# Patient Record
Sex: Female | Born: 1955
Health system: Southern US, Community
[De-identification: ages and names within clinical notes are randomized; demographics above are authoritative.]

## PROBLEM LIST (undated history)

## (undated) DIAGNOSIS — E7800 Pure hypercholesterolemia, unspecified: Secondary | ICD-10-CM

## (undated) DIAGNOSIS — I10 Essential (primary) hypertension: Secondary | ICD-10-CM

## (undated) DIAGNOSIS — K219 Gastro-esophageal reflux disease without esophagitis: Secondary | ICD-10-CM

## (undated) HISTORY — PX: TUBAL LIGATION: SHX77

## (undated) HISTORY — PX: CATARACT EXTRACTION: SUR2

---

## 1998-02-16 ENCOUNTER — Encounter: Payer: Self-pay | Admitting: Internal Medicine

## 1998-02-16 ENCOUNTER — Ambulatory Visit (HOSPITAL_COMMUNITY): Admission: RE | Admit: 1998-02-16 | Discharge: 1998-02-16 | Payer: Self-pay | Admitting: Internal Medicine

## 2000-02-15 ENCOUNTER — Other Ambulatory Visit: Admission: RE | Admit: 2000-02-15 | Discharge: 2000-02-15 | Payer: Self-pay | Admitting: Geriatric Medicine

## 2001-04-03 ENCOUNTER — Other Ambulatory Visit: Admission: RE | Admit: 2001-04-03 | Discharge: 2001-04-03 | Payer: Self-pay | Admitting: Internal Medicine

## 2003-04-15 ENCOUNTER — Other Ambulatory Visit: Admission: RE | Admit: 2003-04-15 | Discharge: 2003-04-15 | Payer: Self-pay | Admitting: Internal Medicine

## 2005-07-15 ENCOUNTER — Other Ambulatory Visit: Admission: RE | Admit: 2005-07-15 | Discharge: 2005-07-15 | Payer: Self-pay | Admitting: Internal Medicine

## 2007-08-20 ENCOUNTER — Other Ambulatory Visit: Admission: RE | Admit: 2007-08-20 | Discharge: 2007-08-20 | Payer: Self-pay | Admitting: Internal Medicine

## 2009-09-08 ENCOUNTER — Other Ambulatory Visit
Admission: RE | Admit: 2009-09-08 | Discharge: 2009-09-08 | Payer: Self-pay | Source: Home / Self Care | Admitting: Internal Medicine

## 2009-09-08 ENCOUNTER — Other Ambulatory Visit: Admission: RE | Admit: 2009-09-08 | Discharge: 2009-09-08 | Payer: Self-pay | Admitting: Internal Medicine

## 2011-09-22 ENCOUNTER — Other Ambulatory Visit: Payer: Self-pay | Admitting: Internal Medicine

## 2011-09-22 ENCOUNTER — Other Ambulatory Visit (HOSPITAL_COMMUNITY)
Admission: RE | Admit: 2011-09-22 | Discharge: 2011-09-22 | Disposition: A | Discharge status: Home / Self Care | Payer: BC Managed Care – PPO | Source: Ambulatory Visit | Attending: Internal Medicine | Admitting: Internal Medicine

## 2011-09-22 DIAGNOSIS — Z01419 Encounter for gynecological examination (general) (routine) without abnormal findings: Secondary | ICD-10-CM | POA: Insufficient documentation

## 2014-11-10 ENCOUNTER — Other Ambulatory Visit: Payer: Self-pay | Admitting: Gastroenterology

## 2015-09-02 DIAGNOSIS — H2513 Age-related nuclear cataract, bilateral: Secondary | ICD-10-CM | POA: Diagnosis not present

## 2015-10-15 DIAGNOSIS — R05 Cough: Secondary | ICD-10-CM | POA: Diagnosis not present

## 2015-10-20 DIAGNOSIS — Z803 Family history of malignant neoplasm of breast: Secondary | ICD-10-CM | POA: Diagnosis not present

## 2015-10-20 DIAGNOSIS — Z1231 Encounter for screening mammogram for malignant neoplasm of breast: Secondary | ICD-10-CM | POA: Diagnosis not present

## 2015-10-21 DIAGNOSIS — D225 Melanocytic nevi of trunk: Secondary | ICD-10-CM | POA: Diagnosis not present

## 2015-10-21 DIAGNOSIS — L821 Other seborrheic keratosis: Secondary | ICD-10-CM | POA: Diagnosis not present

## 2015-10-21 DIAGNOSIS — D2261 Melanocytic nevi of right upper limb, including shoulder: Secondary | ICD-10-CM | POA: Diagnosis not present

## 2015-10-21 DIAGNOSIS — L918 Other hypertrophic disorders of the skin: Secondary | ICD-10-CM | POA: Diagnosis not present

## 2015-10-21 DIAGNOSIS — D2262 Melanocytic nevi of left upper limb, including shoulder: Secondary | ICD-10-CM | POA: Diagnosis not present

## 2016-03-21 DIAGNOSIS — K219 Gastro-esophageal reflux disease without esophagitis: Secondary | ICD-10-CM | POA: Diagnosis not present

## 2016-03-21 DIAGNOSIS — Z79899 Other long term (current) drug therapy: Secondary | ICD-10-CM | POA: Diagnosis not present

## 2016-03-21 DIAGNOSIS — I10 Essential (primary) hypertension: Secondary | ICD-10-CM | POA: Diagnosis not present

## 2016-03-21 DIAGNOSIS — Z0001 Encounter for general adult medical examination with abnormal findings: Secondary | ICD-10-CM | POA: Diagnosis not present

## 2016-03-21 DIAGNOSIS — Z23 Encounter for immunization: Secondary | ICD-10-CM | POA: Diagnosis not present

## 2016-03-21 DIAGNOSIS — E559 Vitamin D deficiency, unspecified: Secondary | ICD-10-CM | POA: Diagnosis not present

## 2016-03-21 DIAGNOSIS — E782 Mixed hyperlipidemia: Secondary | ICD-10-CM | POA: Diagnosis not present

## 2016-06-21 DIAGNOSIS — G8929 Other chronic pain: Secondary | ICD-10-CM | POA: Diagnosis not present

## 2016-06-21 DIAGNOSIS — M25562 Pain in left knee: Secondary | ICD-10-CM | POA: Diagnosis not present

## 2016-11-07 DIAGNOSIS — H524 Presbyopia: Secondary | ICD-10-CM | POA: Diagnosis not present

## 2016-11-07 DIAGNOSIS — H5203 Hypermetropia, bilateral: Secondary | ICD-10-CM | POA: Diagnosis not present

## 2016-11-07 DIAGNOSIS — H52223 Regular astigmatism, bilateral: Secondary | ICD-10-CM | POA: Diagnosis not present

## 2016-11-07 DIAGNOSIS — H2513 Age-related nuclear cataract, bilateral: Secondary | ICD-10-CM | POA: Diagnosis not present

## 2016-11-08 DIAGNOSIS — M8588 Other specified disorders of bone density and structure, other site: Secondary | ICD-10-CM | POA: Diagnosis not present

## 2016-11-08 DIAGNOSIS — Z1231 Encounter for screening mammogram for malignant neoplasm of breast: Secondary | ICD-10-CM | POA: Diagnosis not present

## 2016-12-17 DIAGNOSIS — Z23 Encounter for immunization: Secondary | ICD-10-CM | POA: Diagnosis not present

## 2017-06-21 DIAGNOSIS — K219 Gastro-esophageal reflux disease without esophagitis: Secondary | ICD-10-CM | POA: Diagnosis not present

## 2017-06-21 DIAGNOSIS — Z0001 Encounter for general adult medical examination with abnormal findings: Secondary | ICD-10-CM | POA: Diagnosis not present

## 2017-06-21 DIAGNOSIS — E559 Vitamin D deficiency, unspecified: Secondary | ICD-10-CM | POA: Diagnosis not present

## 2017-06-21 DIAGNOSIS — M859 Disorder of bone density and structure, unspecified: Secondary | ICD-10-CM | POA: Diagnosis not present

## 2017-06-21 DIAGNOSIS — I10 Essential (primary) hypertension: Secondary | ICD-10-CM | POA: Diagnosis not present

## 2017-06-21 DIAGNOSIS — E782 Mixed hyperlipidemia: Secondary | ICD-10-CM | POA: Diagnosis not present

## 2017-11-17 DIAGNOSIS — Z1231 Encounter for screening mammogram for malignant neoplasm of breast: Secondary | ICD-10-CM | POA: Diagnosis not present

## 2017-12-30 DIAGNOSIS — Z23 Encounter for immunization: Secondary | ICD-10-CM | POA: Diagnosis not present

## 2018-01-01 DIAGNOSIS — H9192 Unspecified hearing loss, left ear: Secondary | ICD-10-CM | POA: Diagnosis not present

## 2018-01-01 DIAGNOSIS — H6122 Impacted cerumen, left ear: Secondary | ICD-10-CM | POA: Diagnosis not present

## 2018-04-10 DIAGNOSIS — L600 Ingrowing nail: Secondary | ICD-10-CM | POA: Diagnosis not present

## 2018-05-18 ENCOUNTER — Ambulatory Visit (INDEPENDENT_AMBULATORY_CARE_PROVIDER_SITE_OTHER): Payer: BLUE CROSS/BLUE SHIELD | Admitting: Podiatry

## 2018-05-18 ENCOUNTER — Encounter: Payer: Self-pay | Admitting: Podiatry

## 2018-05-18 VITALS — BP 157/83

## 2018-05-18 DIAGNOSIS — L600 Ingrowing nail: Secondary | ICD-10-CM

## 2018-05-18 MED ORDER — NEOMYCIN-POLYMYXIN-HC 3.5-10000-1 OT SOLN: OTIC | 0 refills | Status: DC

## 2018-05-18 NOTE — Patient Instructions (Signed)

## 2018-05-20 NOTE — Progress Notes (Signed)
Subjective:   Patient ID: Miranda Wells, female   DOB: 63 y.o.   MRN: [id number]   HPI Patient states she is had a chronic ingrown toenail deformity of her right big toe and it makes it hard for her to wear shoe gear comfortably or be comfortable and she is tried soaks and trimming.  Patient does not smoke and likes to be active   Review of Systems  All other systems reviewed and are negative.       Objective:  Physical Exam Vitals signs and nursing note reviewed.  Constitutional:      Appearance: She is well-developed.  Pulmonary:     Effort: Pulmonary effort is normal.  Musculoskeletal: Normal range of motion.  Skin:    General: Skin is warm.  Neurological:     Mental Status: She is alert.     Neurovascular status intact muscle strength is adequate range of motion within normal limits with patient found to have an incurvated right hallux medial border that is painful when pressed and is making shoe gear difficult.  Patient has good digital perfusion is well oriented x3 with no redness or drainage associated with the nail deformity.  Patient states it sore when palpated     Assessment:  Ingrown toenail deformity right hallux medial border with pain     Plan:  H&P condition reviewed and recommended correction.  I explained the procedure and risk and patient wants surgery and signed consent form today I infiltrated the right hallux 60 mg like Marcaine mixture removed the medial border after sterile prep with sterile instrumentation exposed matrix and applied phenol 3 applications 30 seconds followed by alcohol lavage and sterile dressing.  Gave instructions on soaks and to leave dressing on 24 hours but take off earlier if it should start to throb and also went ahead today and wrote prescription for drops to use after soaking and encouraged to call with questions

## 2018-11-21 DIAGNOSIS — Z1231 Encounter for screening mammogram for malignant neoplasm of breast: Secondary | ICD-10-CM | POA: Diagnosis not present

## 2018-11-21 DIAGNOSIS — E559 Vitamin D deficiency, unspecified: Secondary | ICD-10-CM | POA: Diagnosis not present

## 2018-11-21 DIAGNOSIS — I10 Essential (primary) hypertension: Secondary | ICD-10-CM | POA: Diagnosis not present

## 2018-12-10 DIAGNOSIS — Z23 Encounter for immunization: Secondary | ICD-10-CM | POA: Diagnosis not present

## 2018-12-10 DIAGNOSIS — I10 Essential (primary) hypertension: Secondary | ICD-10-CM | POA: Diagnosis not present

## 2018-12-10 DIAGNOSIS — Z0000 Encounter for general adult medical examination without abnormal findings: Secondary | ICD-10-CM | POA: Diagnosis not present

## 2018-12-10 DIAGNOSIS — M8580 Other specified disorders of bone density and structure, unspecified site: Secondary | ICD-10-CM | POA: Diagnosis not present

## 2018-12-10 DIAGNOSIS — E782 Mixed hyperlipidemia: Secondary | ICD-10-CM | POA: Diagnosis not present

## 2018-12-10 DIAGNOSIS — E876 Hypokalemia: Secondary | ICD-10-CM | POA: Diagnosis not present

## 2019-01-14 DIAGNOSIS — I10 Essential (primary) hypertension: Secondary | ICD-10-CM | POA: Diagnosis not present

## 2019-04-03 ENCOUNTER — Other Ambulatory Visit (HOSPITAL_COMMUNITY): Payer: Self-pay | Admitting: Internal Medicine

## 2019-04-03 DIAGNOSIS — R1011 Right upper quadrant pain: Secondary | ICD-10-CM | POA: Diagnosis not present

## 2019-04-03 DIAGNOSIS — K824 Cholesterolosis of gallbladder: Secondary | ICD-10-CM | POA: Diagnosis not present

## 2019-04-05 ENCOUNTER — Emergency Department (HOSPITAL_COMMUNITY): Payer: BC Managed Care – PPO

## 2019-04-05 ENCOUNTER — Encounter (HOSPITAL_COMMUNITY): Payer: Self-pay | Admitting: Emergency Medicine

## 2019-04-05 ENCOUNTER — Other Ambulatory Visit: Payer: Self-pay

## 2019-04-05 ENCOUNTER — Emergency Department (HOSPITAL_COMMUNITY)
Admission: EM | Admit: 2019-04-05 | Discharge: 2019-04-05 | Disposition: A | Discharge status: Home / Self Care | Payer: BC Managed Care – PPO | Attending: Emergency Medicine | Admitting: Emergency Medicine

## 2019-04-05 DIAGNOSIS — Z20822 Contact with and (suspected) exposure to covid-19: Secondary | ICD-10-CM | POA: Insufficient documentation

## 2019-04-05 DIAGNOSIS — R1013 Epigastric pain: Secondary | ICD-10-CM | POA: Diagnosis not present

## 2019-04-05 DIAGNOSIS — K828 Other specified diseases of gallbladder: Secondary | ICD-10-CM | POA: Diagnosis not present

## 2019-04-05 DIAGNOSIS — K5730 Diverticulosis of large intestine without perforation or abscess without bleeding: Secondary | ICD-10-CM | POA: Diagnosis not present

## 2019-04-05 DIAGNOSIS — Z79899 Other long term (current) drug therapy: Secondary | ICD-10-CM | POA: Diagnosis not present

## 2019-04-05 DIAGNOSIS — Z87891 Personal history of nicotine dependence: Secondary | ICD-10-CM | POA: Diagnosis not present

## 2019-04-05 DIAGNOSIS — I10 Essential (primary) hypertension: Secondary | ICD-10-CM | POA: Diagnosis not present

## 2019-04-05 DIAGNOSIS — Z03818 Encounter for observation for suspected exposure to other biological agents ruled out: Secondary | ICD-10-CM | POA: Diagnosis not present

## 2019-04-05 DIAGNOSIS — R1084 Generalized abdominal pain: Secondary | ICD-10-CM | POA: Diagnosis not present

## 2019-04-05 LAB — URINALYSIS, ROUTINE W REFLEX MICROSCOPIC
Bacteria, UA: NONE SEEN
Bilirubin Urine: NEGATIVE
Glucose, UA: 50 mg/dL — AB
Hgb urine dipstick: NEGATIVE
Ketones, ur: 5 mg/dL — AB
Nitrite: NEGATIVE
Protein, ur: 30 mg/dL — AB
Specific Gravity, Urine: 1.019 (ref 1.005–1.030)
pH: 6 (ref 5.0–8.0)

## 2019-04-05 LAB — COMPREHENSIVE METABOLIC PANEL
ALT: 14 U/L (ref 0–44)
AST: 18 U/L (ref 15–41)
Albumin: 4.1 g/dL (ref 3.5–5.0)
Alkaline Phosphatase: 68 U/L (ref 38–126)
Anion gap: 14 (ref 5–15)
BUN: 8 mg/dL (ref 8–23)
CO2: 24 mmol/L (ref 22–32)
Calcium: 9.4 mg/dL (ref 8.9–10.3)
Chloride: 88 mmol/L — ABNORMAL LOW (ref 98–111)
Creatinine, Ser: 0.67 mg/dL (ref 0.44–1.00)
GFR calc Af Amer: >60 mL/min (ref >60)
GFR calc non Af Amer: >60 mL/min (ref >60)
Glucose, Bld: 128 mg/dL — ABNORMAL HIGH (ref 70–99)
Potassium: 2.8 mmol/L — ABNORMAL LOW (ref 3.5–5.1)
Sodium: 126 mmol/L — ABNORMAL LOW (ref 135–145)
Total Bilirubin: 0.4 mg/dL (ref 0.3–1.2)
Total Protein: 7.1 g/dL (ref 6.5–8.1)

## 2019-04-05 LAB — RESPIRATORY PANEL BY RT PCR (FLU A&B, COVID)
Influenza A by PCR: NEGATIVE
Influenza B by PCR: NEGATIVE
SARS Coronavirus 2 by RT PCR: NEGATIVE

## 2019-04-05 LAB — CBC
HCT: 36.7 % (ref 36.0–46.0)
Hemoglobin: 13 g/dL (ref 12.0–15.0)
MCH: 29 pg (ref 26.0–34.0)
MCHC: 35.4 g/dL (ref 30.0–36.0)
MCV: 81.7 fL (ref 80.0–100.0)
Platelets: 315 10*3/uL (ref 150–400)
RBC: 4.49 MIL/uL (ref 3.87–5.11)
RDW: 12.7 % (ref 11.5–15.5)
WBC: 11.6 10*3/uL — ABNORMAL HIGH (ref 4.0–10.5)
nRBC: 0 % (ref 0.0–0.2)

## 2019-04-05 LAB — LIPASE, BLOOD: Lipase: 20 U/L (ref 11–51)

## 2019-04-05 MED ORDER — OMEPRAZOLE 40 MG PO CPDR: 40.0000 mg | DELAYED_RELEASE_CAPSULE | Freq: Every day | ORAL | 0 refills | Status: AC

## 2019-04-05 MED ORDER — POTASSIUM CHLORIDE 10 MEQ/100ML IV SOLN
10.0000 meq | Freq: Once | INTRAVENOUS | Status: AC
  Administered 2019-04-05: 10 meq via INTRAVENOUS
  Filled 2019-04-05: qty 100

## 2019-04-05 MED ORDER — TECHNETIUM TC 99M MEBROFENIN IV KIT
5.0000 | PACK | Freq: Once | INTRAVENOUS | Status: AC | PRN
  Administered 2019-04-05: 5 via INTRAVENOUS

## 2019-04-05 MED ORDER — KETOROLAC TROMETHAMINE 30 MG/ML IJ SOLN
30.0000 mg | Freq: Once | INTRAMUSCULAR | Status: AC
  Administered 2019-04-05: 30 mg via INTRAVENOUS
  Filled 2019-04-05: qty 1

## 2019-04-05 MED ORDER — ONDANSETRON HCL 4 MG PO TABS: 4.0000 mg | ORAL_TABLET | Freq: Four times a day (QID) | ORAL | 0 refills | Status: DC | PRN

## 2019-04-05 MED ORDER — ONDANSETRON HCL 4 MG/2ML IJ SOLN
4.0000 mg | Freq: Once | INTRAMUSCULAR | Status: AC
  Administered 2019-04-05: 4 mg via INTRAVENOUS
  Filled 2019-04-05: qty 2

## 2019-04-05 MED ORDER — OXYCODONE HCL 5 MG PO TABS: 5.0000 mg | ORAL_TABLET | Freq: Four times a day (QID) | ORAL | 0 refills | Status: DC | PRN

## 2019-04-05 MED ORDER — SODIUM CHLORIDE 0.9% FLUSH
3.0000 mL | Freq: Once | INTRAVENOUS | Status: AC
  Administered 2019-04-05: 3 mL via INTRAVENOUS

## 2019-04-05 MED ORDER — IOHEXOL 300 MG/ML  SOLN
100.0000 mL | Freq: Once | INTRAMUSCULAR | Status: AC | PRN
  Administered 2019-04-05: 100 mL via INTRAVENOUS

## 2019-04-05 MED ORDER — SODIUM CHLORIDE 0.9 % IV BOLUS
1000.0000 mL | Freq: Once | INTRAVENOUS | Status: AC
  Administered 2019-04-05: 1000 mL via INTRAVENOUS

## 2019-04-05 NOTE — ED Notes (Signed)
Family at bedside. 

## 2019-04-05 NOTE — Consult Note (Signed)
Miranda Wells January 10, 1956  [id number].    Requesting MD: Dr. Lynelle Doctor Chief Complaint: Epigastric Abdominal Pain Reason for Consult: Biliary dyskinesia  HPI: Miranda Wells is a 64 y.o. female with a history of hypertension, hyperlipidemia, and reported GERD who presented to [facility] ED for epigastric abdominal pain.  Patient reports that Monday evening after eating a banana she began having nonradiating epigastric abdominal pain that she describes as cramping with associated chills and nausea that lasted for ~1 hour.  She went to her PCP the next day who ordered a right upper quadrant ultrasound that showed sludge in the gallbladder.  The patient continued to have daily episodes of pain after eating despite several different food choices including trying to avoid greasy foods.  She tried Tylenol with some relief.  Her symptoms typically lasted for less than 1 hour before resolving however yesterday her symptoms lasted for >5 hours which prompted her to come to the emergency department. At that time her pain was a 8/10.  In the ED she received Toradol and Zofran which fully relieved her symptoms (@ 0700). No further pain medication was required.   In the ED she was afebrile.  WBC mildly elevated at 11.6.  LFTs within normal limits.  Lipase within normal limits.  CT of the abdomen and pelvis was without any acute abnormalities.  There was a small hiatal hernia.  Gallbladder was distended with no calcified stone.  HIDA scan was ordered that showed low EF of 26% consistent with biliary dyskinesia.  Patient did experience clinical symptoms of pain after oral Ensure.  She reports this lasted for several minutes but has now returned to being pain-free again.  Cystic and common bile ducts were patent on HIDA scan.  EDP asked general surgery to see for further recommendations.  Patient denies history of similar pain prior to Monday.  She does report of the last 6 months she has been suffering from heartburn,  burping and belching that occurs after eating.  She believes she is on an acid reflux medication but is unsure what this is.  She has never seen a GI doctor before. She is not on any blood thinners. She reports hx of tubal ligation. No other abdominal surgeries.   ROS: Review of Systems  Constitutional: Positive for chills. Negative for fever.  HENT: Negative for sinus pain.   Respiratory: Negative for cough and shortness of breath.   Cardiovascular: Negative for chest pain.  Gastrointestinal: Positive for abdominal pain and nausea. Negative for constipation, diarrhea and vomiting.  Genitourinary: Negative for dysuria and frequency.  Musculoskeletal: Positive for back pain.  Skin: Negative for rash.  All other systems reviewed and are negative.   No family history on file.  History reviewed. No pertinent past medical history.  History reviewed. No pertinent surgical history.  Social History:  reports that she has quit smoking. She has quit using smokeless tobacco. She reports that she does not drink alcohol or use drugs.  Allergies:  Allergies  Allergen Reactions  . Ace Inhibitors Cough    (Not in a hospital admission)    Physical Exam: Blood pressure (!) 148/70, pulse 73, temperature 97.7 F (36.5 C), temperature source Oral, resp. rate 16, SpO2 97 %. General: pleasant, WD, WN white female who is laying in bed in NAD HEENT: head is normocephalic, atraumatic.  Sclera are noninjected.  PERRL.  Ears and nose without any masses or lesions.  Mouth with mask over  Heart: regular, rate, and  rhythm.  Normal s1,s2. No obvious murmurs, gallops, or rubs noted.  Palpable radial and pedal pulses bilaterally Lungs: CTAB, no wheezes, rhonchi, or rales noted.  Respiratory effort nonlabored Abd: soft, ND, very mild tenderness of the epigastrium and RUQ. No r/r/g. No peritonitis. Neg Murphy's sign.+BS, no masses, hernias, or organomegaly. No abdominal scars.  MS: all 4 extremities are  symmetrical with no cyanosis, clubbing, or edema. Skin: warm and dry with no masses, lesions, or rashes Psych: A&Ox3 with an appropriate affect.  Results for orders placed or performed during the hospital encounter of 04/05/19 (from the past 48 hour(s))  Urinalysis, Routine w reflex microscopic     Status: Abnormal   Collection Time: 04/05/19  2:41 AM  Result Value Ref Range   Color, Urine YELLOW YELLOW   APPearance HAZY (A) CLEAR   Specific Gravity, Urine 1.019 1.005 - 1.030   pH 6.0 5.0 - 8.0   Glucose, UA 50 (A) NEGATIVE mg/dL   Hgb urine dipstick NEGATIVE NEGATIVE   Bilirubin Urine NEGATIVE NEGATIVE   Ketones, ur 5 (A) NEGATIVE mg/dL   Protein, ur 30 (A) NEGATIVE mg/dL   Nitrite NEGATIVE NEGATIVE   Leukocytes,Ua TRACE (A) NEGATIVE   RBC / HPF 0-5 0 - 5 RBC/hpf   WBC, UA 0-5 0 - 5 WBC/hpf   Bacteria, UA NONE SEEN NONE SEEN   Squamous Epithelial / LPF 0-5 0 - 5   Mucus PRESENT    Non Squamous Epithelial 0-5 (A) NONE SEEN    Comment: Performed at [facility], [address], [place], [place] [address]  Lipase, blood     Status: None   Collection Time: 04/05/19  2:44 AM  Result Value Ref Range   Lipase 20 11 - 51 U/L    Comment: Performed at [facility], [address] [place], [place] [address]  Comprehensive metabolic panel     Status: Abnormal   Collection Time: 04/05/19  2:44 AM  Result Value Ref Range   Sodium 126 (L) 135 - 145 mmol/L   Potassium 2.8 (L) 3.5 - 5.1 mmol/L   Chloride 88 (L) 98 - 111 mmol/L   CO2 24 22 - 32 mmol/L   Glucose, Bld 128 (H) 70 - 99 mg/dL   BUN 8 8 - 23 mg/dL   Creatinine, Ser 2.54 0.44 - 1.00 mg/dL   Calcium 9.4 8.9 - 27.0 mg/dL   Total Protein 7.1 6.5 - 8.1 g/dL   Albumin 4.1 3.5 - 5.0 g/dL   AST 18 15 - 41 U/L   ALT 14 0 - 44 U/L   Alkaline Phosphatase 68 38 - 126 U/L   Total Bilirubin 0.4 0.3 - 1.2 mg/dL   GFR calc non Af Amer >60 >60 mL/min   GFR calc Af Amer >60 >60 mL/min   Anion gap 14 5 - 15    Comment:  Performed at [facility], [address] [place], [place] [address]  CBC     Status: Abnormal   Collection Time: 04/05/19  2:44 AM  Result Value Ref Range   WBC 11.6 (H) 4.0 - 10.5 K/uL   RBC 4.49 3.87 - 5.11 MIL/uL   Hemoglobin 13.0 12.0 - 15.0 g/dL   HCT 28.3 15.1 - 76.1 %   MCV 81.7 80.0 - 100.0 fL   MCH 29.0 26.0 - 34.0 pg   MCHC 35.4 30.0 - 36.0 g/dL   RDW 60.7 37.1 - 06.2 %   Platelets 315 150 -  400 K/uL   nRBC 0.0 0.0 - 0.2 %    Comment: Performed at [facility], [place] [address] [place], [place] [address]   CT ABDOMEN PELVIS W CONTRAST  Result Date: 04/05/2019 CLINICAL DATA:  Chest and abdominal cramping. EXAM: CT ABDOMEN AND PELVIS WITH CONTRAST TECHNIQUE: Multidetector CT imaging of the abdomen and pelvis was performed using the standard protocol following bolus administration of intravenous contrast. CONTRAST:  173mL OMNIPAQUE IOHEXOL 300 MG/ML  SOLN COMPARISON:  Right upper quadrant ultrasound 04/03/2019 FINDINGS: Lower chest: The lung bases are clear. There are coronary artery calcifications. Heart is normal in size. Hepatobiliary: Tiny subcapsular hypodensity in the right hepatic dome, too small to characterize but likely small cyst. Gallbladder physiologically distended, no calcified stone. No biliary dilatation. Pancreas: No ductal dilatation or inflammation. Spleen: Normal in size without focal abnormality. Adrenals/Urinary Tract: Normal adrenal glands. No hydronephrosis or perinephric edema. Homogeneous renal enhancement with symmetric excretion on delayed phase imaging. Tiny subcentimeter hypodensities in both kidneys are too small to characterize but likely small cysts. No visualized renal calculi. Urinary bladder is partially distended without wall thickening. Stomach/Bowel: Tiny hiatal hernia. Stomach is otherwise unremarkable. No bowel obstruction, inflammation, or wall thickening. Normal appendix. Moderate stool burden in the colon. No colonic wall thickening  or inflammation. Mild distal diverticulosis without diverticulitis. Vascular/Lymphatic: Advanced aortic atherosclerosis. No aortic aneurysm. The portal vein is patent. No enlarged lymph nodes in the abdomen or pelvis. Reproductive: Uterus and bilateral adnexa are unremarkable. Other: No free air, free fluid, or intra-abdominal fluid collection. Tiny fat containing umbilical hernia. Musculoskeletal: Endplate sclerosis inferior L4 vertebral body likely Modic endplate changes. Associated L4-L5 disc space narrowing. Small bone island in the right proximal femur. IMPRESSION: 1. No acute abnormality in the abdomen/pelvis. 2. Mild distal colonic diverticulosis without diverticulitis. Small hiatal hernia. 3. Moderately advanced aortic atherosclerosis. Aortic Atherosclerosis (ICD10-I70.0). Electronically Signed   By: Keith Rake M.D.   On: 04/05/2019 06:33   NM Hepato W/EF  Result Date: 04/05/2019 CLINICAL DATA:  Epigastric region pain EXAM: NUCLEAR MEDICINE HEPATOBILIARY IMAGING WITH GALLBLADDER EF VIEWS: Anterior right upper quadrant RADIOPHARMACEUTICALS:  5.2 mCi Tc-34m  Choletec IV COMPARISON:  None. FINDINGS: Liver uptake of radiotracer is unremarkable. There is prompt visualization of gallbladder and small bowel, indicating patency of the cystic and common bile ducts. The patient consumed 8 ounces of Ensure orally with calculation of the computer generated ejection fraction of radiotracer from the gallbladder. The patient experienced abdominal pain and spasm with the oral Ensure consumption. The computer generated ejection fraction of radiotracer from the gallbladder is abnormally low at 26%, normal greater than 33% using the oral agent. IMPRESSION: Abnormally low ejection fraction of radiotracer gallbladder, a finding indicative of biliary dyskinesia. Patient experience clinical symptoms with the oral Ensure consumption. Cystic and common bile ducts are patent as is evidenced by visualization of gallbladder  and small bowel. Electronically Signed   By: Lowella Grip III M.D.   On: 04/05/2019 10:16      Assessment/Plan HTN HLD Burping/Belching/hx of GERD - start on omeprazole   Epigastric abdominal pain Biliary Dyskinesia - HIDA scan showed no evidence of cystic duct obstruction to suggest acute cholecystitis.  LFTs within normal limits.  She reports that her pain has resolved since Toradol injection at 7 AM this morning. Abd exam is reassuring.  -I had a long discussion with the patient about her symptoms, lab work and imaging.  We discussed the possibility of admission for laparoscopic cholecystectomy tomorrow if  schedule allows.  We discussed the success of symptom relief after cholecystectomy for biliary dyskinesia is roughly 50%.  We also discussed possibility of treating her as an outpatient with pain/nausea medication and diet modification with close follow-up in our clinic to discuss possible arrangement of surgery as an outpatient.  -Given the patient is currently symptom-free, she felt it was reasonable to try treatment as an outpatient with close follow up.  I discussed eating low-fat and smaller meals.  I will send her home with a prescription of oxycodone for pain.  We discussed Tylenol as first-line for pain.  I am also starting her on omeprazole given that she reports 6 months of burping/belching that typically occurs after eating.  -We discussed reasons to call our office sooner or return to the emergency department -I discussed this with my attending as well as the ED attending who felt this was appropriate  Jacinto Halim, [facility] Surgery 04/05/2019, 1:26 PM Please see Amion for pager number during day hours 7:00am-4:30pm

## 2019-04-05 NOTE — Discharge Instructions (Signed)
Biliary Dyskinesia  Biliary dyskinesia is a condition in which the gallbladder or bile ducts cannot release or move bile normally. Bile is a fluid that is made in the liver. It helps the body to digest food. Bile flows to the gallbladder to be stored. When bile is needed for digestion, it leaves the gallbladder and flows through the bile ducts into the digestive tract. Biliary dyskinesia causes bile to build up, and that can cause abdomen (abdominal)pain. This condition may also be called:  Acalculous cholecystopathy.  Functional gallbladder disorder.  Sphincter of Oddi dysfunction. All these names describe gallbladder disorders that are not caused by gallbladder stones. Sphincter of Oddi dysfunction happens in the area where the duct for digestive juices from the pancreas (pancreatic duct) joins the bile duct before emptying into the digestive tract. If the opening into the digestive tract is too small, bile and pancreatic juices may back up and cause abdominal pain. What are the causes? The cause of biliary dyskinesia is poor function of the gallbladder. The exact reason this happens is often unknown. One reason may be changes in the gallbladder that are caused by obesity. What increases the risk? You are more likely to develop this condition if your mother or father had it, or if you are:  Overweight.  Female.  57-68 years old. What are the signs or symptoms? The main symptom of this condition is pain in the upper right side of the abdomen. Typically, the pain:  Starts about 30 minutes after a meal, especially a meal that is spicy or greasy.  Lasts for 30 minutes or longer.  Builds up gradually until it is a steady pain that is severe enough to interrupt daily activities. Other symptoms may include:  Nausea.  Vomiting.  Cramping.  Bloating.  Heartburn.  Diarrhea. How is this diagnosed? This condition may be diagnosed based on your symptoms, your medical history, and a  physical exam. You may have tests to rule out other conditions and to confirm the diagnosis. These may include:  Blood tests.  Ultrasound tests of the gallbladder.  HIDA scan. This is an X-ray test that can show if your gallbladder empties less than a normal amount of bile (gallbladder ejection fraction).  MRI or CT scan of the abdomen.  ERCP (endoscopic retrograde cholangiopancreatogram). During this procedure, a thin tube with a camera on the end is inserted into the throat and down into areas that need examination, such as the pancreas, bile ducts, liver, and gallbladder. Dye is injected into your blood, then X-rays are done. The dye helps your health care provider to see the areas that need examination. ERCP may be done to help diagnose sphincter of Oddi dysfunction. How is this treated? Treatment depends on the cause. The first step of treatment is usually to make lifestyle changes. Your health care provider may recommend:  Taking over-the-counter or prescription pain medicine.  Resting.  Losing weight.  Avoiding foods that are spicy or greasy. In some cases, the condition gets better with lifestyle changes only. However, in many cases, surgery to remove the gallbladder (cholecystectomy) is necessary. Follow these instructions at home:  Take over-the-counter and prescription medicines only as told by your health care provider.  Do not drive or use heavy machinery while taking prescription pain medicine.  If you are taking prescription pain medicine, take actions to prevent or treat constipation. Your health care provider may recommend that you: ? Drink enough fluid to keep your urine pale yellow. ? Eat foods that  are high in fiber, such as fresh fruits and vegetables, whole grains, and beans. ? Limit foods that are high in fat and processed sugars, such as fried or sweet foods. ? Take an over-the-counter or prescription medicine for constipation.  Rest and return to your normal  activities as told by your health care provider. Ask your health care provider what activities are safe for you.  Follow instructions from your health care provider about eating or drinking restrictions. You may need to limit fatty, greasy, and spicy foods if they cause symptoms.  Limit alcohol intake to no more than 1 drink a day for nonpregnant women and 2 drinks a day for men. One drink equals 12 oz of beer, 5 oz of wine, or 1 oz of hard liquor. Alcohol can irritate your stomach and your liver.  Do not use any products that contain nicotine or tobacco, such as cigarettes and e-cigarettes. If you need help quitting, ask your health care provider. Smoking can damage your digestive system.  Keep all follow-up visits as told by your health care provider. This is important. Contact a health care provider if:  Your abdominal pain comes back.  You have any of the following: ? Nausea. ? Vomiting. ? Diarrhea. ? Cramping. ? Bloating. ? Diarrhea. Summary  Biliary dyskinesia is a condition in which your gallbladder or bile ducts cannot release or move bile normally.  The main symptom of this condition is pain in the upper right side of the abdomen. The pain typically starts about 30 minutes after a meal, especially a meal that is spicy or greasy.  Treatment may include lifestyle changes, such as working to lose weight and avoiding certain foods. In many cases, surgery to remove the gallbladder (cholecystectomy) is necessary. This information is not intended to replace advice given to you by your health care provider. Make sure you discuss any questions you have with your health care provider. Document Revised: 02/10/2017 Document Reviewed: 12/01/2016 Elsevier Patient Education  2020 [employer]    Gallbladder Eating Plan If you have a gallbladder condition, you may have trouble digesting fats. Eating a low-fat diet can help reduce your symptoms, and may be helpful before and after having  surgery to remove your gallbladder (cholecystectomy). Your health care provider may recommend that you work with a diet and nutrition specialist (dietitian) to help you reduce the amount of fat in your diet. What are tips for following this plan? General guidelines  Limit your fat intake to less than 30% of your total daily calories. If you eat around 1,800 calories each day, this is less than 60 grams (g) of fat per day.  Fat is an important part of a healthy diet. Eating a low-fat diet can make it hard to maintain a healthy body weight. Ask your dietitian how much fat, calories, and other nutrients you need each day.  Eat small, frequent meals throughout the day instead of three large meals.  Drink at least 8-10 cups of fluid a day. Drink enough fluid to keep your urine clear or pale yellow.  Limit alcohol intake to no more than 1 drink a day for nonpregnant women and 2 drinks a day for men. One drink equals 12 oz of beer, 5 oz of wine, or 1 oz of hard liquor. Reading food labels  Check Nutrition Facts on food labels for the amount of fat per serving. Choose foods with less than 3 grams of fat per serving. Shopping  Choose nonfat and low-fat healthy  foods. Look for the words "nonfat," "low fat," or "fat free."  Avoid buying processed or prepackaged foods. Cooking  Cook using low-fat methods, such as baking, broiling, grilling, or boiling.  Cook with small amounts of healthy fats, such as olive oil, grapeseed oil, canola oil, or sunflower oil. What foods are recommended?   All fresh, frozen, or canned fruits and vegetables.  Whole grains.  Low-fat or non-fat (skim) milk and yogurt.  Lean meat, skinless poultry, fish, eggs, and beans.  Low-fat protein supplement powders or drinks.  Spices and herbs. What foods are not recommended?  High-fat foods. These include baked goods, fast food, fatty cuts of meat, ice cream, french toast, sweet rolls, pizza, cheese bread, foods  covered with butter, creamy sauces, or cheese.  Fried foods. These include french fries, tempura, battered fish, breaded chicken, fried breads, and sweets.  Foods with strong odors.  Foods that cause bloating and gas. Summary  A low-fat diet can be helpful if you have a gallbladder condition, or before and after gallbladder surgery.  Limit your fat intake to less than 30% of your total daily calories. This is about 60 g of fat if you eat 1,800 calories each day.  Eat small, frequent meals throughout the day instead of three large meals. This information is not intended to replace advice given to you by your health care provider. Make sure you discuss any questions you have with your health care provider. Document Revised: 06/21/2018 Document Reviewed: 04/07/2016 Elsevier Patient Education  [employer].

## 2019-04-05 NOTE — ED Provider Notes (Signed)
Patient was initially seen by Dr. Judd Lien.  Please see his note.  Patient presented with increasing abdominal discomfort associated with eating.  She had an outpatient work-up that showed biliary sludge but no cholecystitis.  Patient had a HIDA scan performed here in the ED.  Patient's HIDA scan demonstrates abnormally low ejection fraction suggestive of biliary dyskinesia.  Considering the patient's worsening symptoms I will consult with general surgery to get their recommendations.  1:33 PM PT seen by general surgery team.  Options discussed with pt.  Pt opted for outpatient management.   CT ABDOMEN PELVIS W CONTRAST  Result Date: 04/05/2019 CLINICAL DATA:  Chest and abdominal cramping. EXAM: CT ABDOMEN AND PELVIS WITH CONTRAST TECHNIQUE: Multidetector CT imaging of the abdomen and pelvis was performed using the standard protocol following bolus administration of intravenous contrast. CONTRAST:  OMNIPAQUE IOHEXOL 300 MG/ML  SOLN COMPARISON:  Right upper quadrant ultrasound 04/03/2019 FINDINGS: Lower chest: The lung bases are clear. There are coronary artery calcifications. Heart is normal in size. Hepatobiliary: Tiny subcapsular hypodensity in the right hepatic dome, too small to characterize but likely small cyst. Gallbladder physiologically distended, no calcified stone. No biliary dilatation. Pancreas: No ductal dilatation or inflammation. Spleen: Normal in size without focal abnormality. Adrenals/Urinary Tract: Normal adrenal glands. No hydronephrosis or perinephric edema. Homogeneous renal enhancement with symmetric excretion on delayed phase imaging. Tiny subcentimeter hypodensities in both kidneys are too small to characterize but likely small cysts. No visualized renal calculi. Urinary bladder is partially distended without wall thickening. Stomach/Bowel: Tiny hiatal hernia. Stomach is otherwise unremarkable. No bowel obstruction, inflammation, or wall thickening. Normal appendix. Moderate stool  burden in the colon. No colonic wall thickening or inflammation. Mild distal diverticulosis without diverticulitis. Vascular/Lymphatic: Advanced aortic atherosclerosis. No aortic aneurysm. The portal vein is patent. No enlarged lymph nodes in the abdomen or pelvis. Reproductive: Uterus and bilateral adnexa are unremarkable. Other: No free air, free fluid, or intra-abdominal fluid collection. Tiny fat containing umbilical hernia. Musculoskeletal: Endplate sclerosis inferior L4 vertebral body likely Modic endplate changes. Associated L4-L5 disc space narrowing. Small bone island in the right proximal femur. IMPRESSION: 1. No acute abnormality in the abdomen/pelvis. 2. Mild distal colonic diverticulosis without diverticulitis. Small hiatal hernia. 3. Moderately advanced aortic atherosclerosis. Aortic Atherosclerosis (ICD10-I70.0). Electronically Signed   By: Narda Rutherford M.D.   On: 04/05/2019 06:33   NM Hepato W/EF  Result Date: 04/05/2019 CLINICAL DATA:  Epigastric region pain EXAM: NUCLEAR MEDICINE HEPATOBILIARY IMAGING WITH GALLBLADDER EF VIEWS: Anterior right upper quadrant RADIOPHARMACEUTICALS:  5.2 mCi Tc-35m  Choletec IV COMPARISON:  None. FINDINGS: Liver uptake of radiotracer is unremarkable. There is prompt visualization of gallbladder and small bowel, indicating patency of the cystic and common bile ducts. The patient consumed 8 ounces of Ensure orally with calculation of the computer generated ejection fraction of radiotracer from the gallbladder. The patient experienced abdominal pain and spasm with the oral Ensure consumption. The computer generated ejection fraction of radiotracer from the gallbladder is abnormally low at 26%, normal greater than 33% using the oral agent. IMPRESSION: Abnormally low ejection fraction of radiotracer gallbladder, a finding indicative of biliary dyskinesia. Patient experience clinical symptoms with the oral Ensure consumption. Cystic and common bile ducts are patent  as is evidenced by visualization of gallbladder and small bowel. Electronically Signed   By: Bretta Bang III M.D.   On: 04/05/2019 10:16      Linwood Dibbles, MD 04/05/19 [phone]

## 2019-04-05 NOTE — ED Triage Notes (Signed)
Patient reports abdominal "cramping/contractions" onset Monday this week , denies emesis or diarrhea , no fever or dysuria .

## 2019-04-05 NOTE — ED Provider Notes (Signed)
[facility] EMERGENCY DEPARTMENT Provider Note   CSN: [id number] Arrival date & time: 04/05/19  6226     History Chief Complaint  Patient presents with  . Abdominal Pain    Miranda Wells is a 64 y.o. female.  Patient is a 64 year old female with past medical history of hyperlipidemia and hypertension.  She presents today for evaluation of abdominal discomfort.  She describes a 4-day history of pain to the epigastric region.  This is worse when she eats or drinks and is worsening to the point where she can hardly consume water without discomfort.  Every attempt to eat or drink resulted in generalized abdominal cramping and pain.  She denies any vomiting but has felt nauseated.  She denies any fevers or chills.  She denies any urinary complaints.  She was seen by her primary doctor and was sent for an ultrasound earlier this week.  This apparently showed sludge within the gallbladder, however no cholecystitis or definitive stones.  I do not have access to this report.  The history is provided by the patient.  Abdominal Pain Pain location:  Generalized Pain quality: cramping   Pain radiates to:  Does not radiate Pain severity:  Moderate Onset quality:  Gradual Duration:  4 days Timing:  Intermittent Progression:  Worsening Chronicity:  New Relieved by:  Nothing Worsened by:  Palpation, movement and eating Ineffective treatments:  None tried      History reviewed. No pertinent past medical history.  There are no problems to display for this patient.   History reviewed. No pertinent surgical history.   OB History   No obstetric history on file.     No family history on file.  Social History   Tobacco Use  . Smoking status: Former Games developer  . Smokeless tobacco: Former Engineer, water Use Topics  . Alcohol use: Never  . Drug use: Never    Home Medications Prior to Admission medications   Medication Sig Start Date End Date Taking? Authorizing  Provider  amLODipine (NORVASC) 10 MG tablet TK 1 T PO  ONCE A DAY 03/27/18   [provider]  Ascorbic Acid (VITAMIN C PO) Take by mouth.    [provider]  atorvastatin (LIPITOR) 40 MG tablet TK 1 T PO D 04/24/18   [provider]  CALCIUM PO Take 500 mg by mouth.    [provider]  cephALEXin (KEFLEX) 500 MG capsule TK ONE C PO TID 04/10/18   [provider]  Cholecalciferol (VITAMIN D3 PO) Take by mouth.    [provider]  cloNIDine (CATAPRES) 0.1 MG tablet TK ONE T PO ONCE A DAY HS 03/27/18   [provider]  famciclovir (FAMVIR) 125 MG tablet TK 1 T PO BID FOR 5 DAYS 12/22/17   [provider]  hydrochlorothiazide (HYDRODIURIL) 25 MG tablet TK 1 T PO QAM 03/27/18   [provider]  losartan (COZAAR) 100 MG tablet TK 1 T PO  ONCE QD 04/26/18   [provider]  neomycin-polymyxin-hydrocortisone (CORTISPORIN) OTIC solution Apply 1-2 drops to toe after soaking twice a day 05/18/18   Lenn Sink, DPM  raloxifene (EVISTA) 60 MG tablet TK 1 T PO D 04/24/18   [provider]    Allergies    Ace inhibitors  Review of Systems   Review of Systems  Gastrointestinal: Positive for abdominal pain.  All other systems reviewed and are negative.   Physical Exam Updated Vital Signs BP Marland Kitchen)  155/70   Pulse 66   Temp 97.7 F (36.5 C) (Oral)   Resp 17   SpO2 98%   Physical Exam Vitals and nursing note reviewed.  Constitutional:      General: She is not in acute distress.    Appearance: She is well-developed. She is not diaphoretic.  HENT:     Head: Normocephalic and atraumatic.  Cardiovascular:     Rate and Rhythm: Normal rate and regular rhythm.     Heart sounds: No murmur. No friction rub. No gallop.   Pulmonary:     Effort: Pulmonary effort is normal. No respiratory distress.     Breath sounds: Normal breath sounds. No wheezing.  Abdominal:     General: Bowel sounds are normal. There is no  distension.     Palpations: Abdomen is soft.     Tenderness: There is abdominal tenderness in the epigastric area. There is no right CVA tenderness, left CVA tenderness, guarding or rebound.  Musculoskeletal:        General: Normal range of motion.     Cervical back: Normal range of motion and neck supple.  Skin:    General: Skin is warm and dry.  Neurological:     Mental Status: She is alert and oriented to person, place, and time.     ED Results / Procedures / Treatments   Labs (all labs ordered are listed, but only abnormal results are displayed) Labs Reviewed  COMPREHENSIVE METABOLIC PANEL - Abnormal; Notable for the following components:      Result Value   Sodium 126 (*)    Potassium 2.8 (*)    Chloride 88 (*)    Glucose, Bld 128 (*)    All other components within normal limits  CBC - Abnormal; Notable for the following components:   WBC 11.6 (*)    All other components within normal limits  URINALYSIS, ROUTINE W REFLEX MICROSCOPIC - Abnormal; Notable for the following components:   APPearance HAZY (*)    Glucose, UA 50 (*)    Ketones, ur 5 (*)    Protein, ur 30 (*)    Leukocytes,Ua TRACE (*)    Non Squamous Epithelial 0-5 (*)    All other components within normal limits  LIPASE, BLOOD    EKG None  Radiology No results found.  Procedures Procedures (including critical care time)  Medications Ordered in ED Medications  sodium chloride 0.9 % bolus 1,000 mL (has no administration in time range)  sodium chloride flush (NS) 0.9 % injection 3 mL (3 mLs Intravenous Given 04/05/19 1308)    ED Course  I have reviewed the triage vital signs and the nursing notes.  Pertinent labs & imaging results that were available during my care of the patient were reviewed by me and considered in my medical decision making (see chart for details).  Patient presents with complaints of abdominal pain as described in the HPI.  She was seen by her primary doctor and underwent an  ultrasound which showed sludge, but no stones or cholecystitis.  She is to have a HIDA scan performed in the near future, however her pain worsened this evening.  She reports having difficulty drinking even water or eating.  Her work-up today shows basically unremarkable laboratory studies and CT scan of the abdomen and pelvis which is unremarkable.  Patient will go for a HIDA scan.  Care to be signed out to Dr. Lynelle Doctor at shift change.  He will obtain the results of the HIDA  scan and determine the final disposition.  MDM Rules/Calculators/A&P    Final Clinical Impression(s) / ED Diagnoses Final diagnoses:  None    Rx / DC Orders ED Discharge Orders    None       Veryl Speak, MD 04/05/19 [phone]

## 2019-04-09 ENCOUNTER — Ambulatory Visit
Admission: RE | Admit: 2019-04-09 | Discharge: 2019-04-09 | Disposition: A | Discharge status: Home / Self Care | Payer: BC Managed Care – PPO | Source: Ambulatory Visit | Attending: Internal Medicine | Admitting: Internal Medicine

## 2019-04-09 ENCOUNTER — Other Ambulatory Visit: Payer: Self-pay | Admitting: Internal Medicine

## 2019-04-09 DIAGNOSIS — K5900 Constipation, unspecified: Secondary | ICD-10-CM

## 2019-04-09 DIAGNOSIS — E876 Hypokalemia: Secondary | ICD-10-CM | POA: Diagnosis not present

## 2019-04-09 DIAGNOSIS — R109 Unspecified abdominal pain: Secondary | ICD-10-CM | POA: Diagnosis not present

## 2019-04-10 ENCOUNTER — Inpatient Hospital Stay (HOSPITAL_COMMUNITY)
Admission: EM | Admit: 2019-04-10 | Discharge: 2019-04-11 | DRG: 418 | Disposition: A | Discharge status: Home / Self Care | Payer: BC Managed Care – PPO | Attending: General Surgery | Admitting: General Surgery

## 2019-04-10 ENCOUNTER — Encounter (HOSPITAL_COMMUNITY): Payer: Self-pay | Admitting: *Deleted

## 2019-04-10 ENCOUNTER — Emergency Department (HOSPITAL_COMMUNITY): Payer: BC Managed Care – PPO

## 2019-04-10 ENCOUNTER — Other Ambulatory Visit: Payer: Self-pay

## 2019-04-10 DIAGNOSIS — K219 Gastro-esophageal reflux disease without esophagitis: Secondary | ICD-10-CM | POA: Diagnosis not present

## 2019-04-10 DIAGNOSIS — E871 Hypo-osmolality and hyponatremia: Secondary | ICD-10-CM | POA: Diagnosis not present

## 2019-04-10 DIAGNOSIS — R109 Unspecified abdominal pain: Secondary | ICD-10-CM

## 2019-04-10 DIAGNOSIS — K811 Chronic cholecystitis: Secondary | ICD-10-CM | POA: Diagnosis present

## 2019-04-10 DIAGNOSIS — I10 Essential (primary) hypertension: Secondary | ICD-10-CM | POA: Diagnosis present

## 2019-04-10 DIAGNOSIS — Z20822 Contact with and (suspected) exposure to covid-19: Secondary | ICD-10-CM | POA: Diagnosis present

## 2019-04-10 DIAGNOSIS — K5900 Constipation, unspecified: Secondary | ICD-10-CM | POA: Diagnosis not present

## 2019-04-10 DIAGNOSIS — E785 Hyperlipidemia, unspecified: Secondary | ICD-10-CM | POA: Diagnosis not present

## 2019-04-10 DIAGNOSIS — Z87891 Personal history of nicotine dependence: Secondary | ICD-10-CM | POA: Diagnosis not present

## 2019-04-10 DIAGNOSIS — R52 Pain, unspecified: Secondary | ICD-10-CM | POA: Diagnosis not present

## 2019-04-10 DIAGNOSIS — Z888 Allergy status to other drugs, medicaments and biological substances status: Secondary | ICD-10-CM | POA: Diagnosis not present

## 2019-04-10 DIAGNOSIS — E876 Hypokalemia: Secondary | ICD-10-CM | POA: Diagnosis not present

## 2019-04-10 DIAGNOSIS — K828 Other specified diseases of gallbladder: Secondary | ICD-10-CM | POA: Diagnosis not present

## 2019-04-10 DIAGNOSIS — E860 Dehydration: Secondary | ICD-10-CM

## 2019-04-10 DIAGNOSIS — E7800 Pure hypercholesterolemia, unspecified: Secondary | ICD-10-CM | POA: Diagnosis not present

## 2019-04-10 DIAGNOSIS — Z79891 Long term (current) use of opiate analgesic: Secondary | ICD-10-CM

## 2019-04-10 DIAGNOSIS — Z79899 Other long term (current) drug therapy: Secondary | ICD-10-CM

## 2019-04-10 DIAGNOSIS — R1084 Generalized abdominal pain: Secondary | ICD-10-CM | POA: Diagnosis not present

## 2019-04-10 DIAGNOSIS — K824 Cholesterolosis of gallbladder: Secondary | ICD-10-CM | POA: Diagnosis not present

## 2019-04-10 DIAGNOSIS — N179 Acute kidney failure, unspecified: Secondary | ICD-10-CM | POA: Diagnosis present

## 2019-04-10 HISTORY — DX: Essential (primary) hypertension: I10

## 2019-04-10 HISTORY — DX: Gastro-esophageal reflux disease without esophagitis: K21.9

## 2019-04-10 HISTORY — DX: Pure hypercholesterolemia, unspecified: E78.00

## 2019-04-10 LAB — CBC WITH DIFFERENTIAL/PLATELET
Abs Immature Granulocytes: 0.03 10*3/uL (ref 0.00–0.07)
Basophils Absolute: 0 10*3/uL (ref 0.0–0.1)
Basophils Relative: 0 %
Eosinophils Absolute: 0 10*3/uL (ref 0.0–0.5)
Eosinophils Relative: 0 %
HCT: 37.8 % (ref 36.0–46.0)
Hemoglobin: 13.3 g/dL (ref 12.0–15.0)
Immature Granulocytes: 0 %
Lymphocytes Relative: 13 %
Lymphs Abs: 1.3 10*3/uL (ref 0.7–4.0)
MCH: 28.7 pg (ref 26.0–34.0)
MCHC: 35.2 g/dL (ref 30.0–36.0)
MCV: 81.6 fL (ref 80.0–100.0)
Monocytes Absolute: 0.9 10*3/uL (ref 0.1–1.0)
Monocytes Relative: 9 %
Neutro Abs: 7.6 10*3/uL (ref 1.7–7.7)
Neutrophils Relative %: 78 %
Platelets: 282 10*3/uL (ref 150–400)
RBC: 4.63 MIL/uL (ref 3.87–5.11)
RDW: 13.1 % (ref 11.5–15.5)
WBC: 9.8 10*3/uL (ref 4.0–10.5)
nRBC: 0 % (ref 0.0–0.2)

## 2019-04-10 LAB — RESPIRATORY PANEL BY RT PCR (FLU A&B, COVID)
Influenza A by PCR: NEGATIVE
Influenza B by PCR: NEGATIVE
SARS Coronavirus 2 by RT PCR: NEGATIVE

## 2019-04-10 LAB — COMPREHENSIVE METABOLIC PANEL
ALT: 17 U/L (ref 0–44)
AST: 26 U/L (ref 15–41)
Albumin: 4.2 g/dL (ref 3.5–5.0)
Alkaline Phosphatase: 66 U/L (ref 38–126)
Anion gap: 15 (ref 5–15)
BUN: 15 mg/dL (ref 8–23)
CO2: 26 mmol/L (ref 22–32)
Calcium: 9.7 mg/dL (ref 8.9–10.3)
Chloride: 86 mmol/L — ABNORMAL LOW (ref 98–111)
Creatinine, Ser: 1.74 mg/dL — ABNORMAL HIGH (ref 0.44–1.00)
GFR calc Af Amer: 35 mL/min — ABNORMAL LOW (ref >60)
GFR calc non Af Amer: 30 mL/min — ABNORMAL LOW (ref >60)
Glucose, Bld: 111 mg/dL — ABNORMAL HIGH (ref 70–99)
Potassium: 3 mmol/L — ABNORMAL LOW (ref 3.5–5.1)
Sodium: 127 mmol/L — ABNORMAL LOW (ref 135–145)
Total Bilirubin: 1 mg/dL (ref 0.3–1.2)
Total Protein: 7.1 g/dL (ref 6.5–8.1)

## 2019-04-10 LAB — MAGNESIUM: Magnesium: 2 mg/dL (ref 1.7–2.4)

## 2019-04-10 LAB — SURGICAL PCR SCREEN
MRSA, PCR: NEGATIVE
Staphylococcus aureus: NEGATIVE

## 2019-04-10 LAB — HIV ANTIBODY (ROUTINE TESTING W REFLEX): HIV Screen 4th Generation wRfx: NONREACTIVE

## 2019-04-10 LAB — LIPASE, BLOOD: Lipase: 21 U/L (ref 11–51)

## 2019-04-10 MED ORDER — SODIUM CHLORIDE 0.9 % IV SOLN
2.0000 g | INTRAVENOUS | Status: DC
  Administered 2019-04-10 – 2019-04-11 (×2): 2 g via INTRAVENOUS
  Filled 2019-04-10: qty 2
  Filled 2019-04-10: qty 20

## 2019-04-10 MED ORDER — BISACODYL 10 MG RE SUPP: 10.0000 mg | Freq: Every day | RECTAL | Status: DC | PRN

## 2019-04-10 MED ORDER — LACTATED RINGERS IV BOLUS
1000.0000 mL | Freq: Once | INTRAVENOUS | Status: AC
  Administered 2019-04-10: 10:00:00 1000 mL via INTRAVENOUS

## 2019-04-10 MED ORDER — OXYCODONE HCL 5 MG PO TABS
5.0000 mg | ORAL_TABLET | ORAL | Status: DC | PRN
  Administered 2019-04-11: 15:00:00 5 mg via ORAL
  Filled 2019-04-10: qty 1

## 2019-04-10 MED ORDER — METOPROLOL SUCCINATE ER 25 MG PO TB24
25.0000 mg | ORAL_TABLET | Freq: Every day | ORAL | Status: DC
  Administered 2019-04-10 – 2019-04-11 (×2): 25 mg via ORAL
  Filled 2019-04-10 (×3): qty 1

## 2019-04-10 MED ORDER — ONDANSETRON 4 MG PO TBDP: 4.0000 mg | ORAL_TABLET | Freq: Four times a day (QID) | ORAL | Status: DC | PRN

## 2019-04-10 MED ORDER — ENOXAPARIN SODIUM 40 MG/0.4ML ~~LOC~~ SOLN
40.0000 mg | SUBCUTANEOUS | Status: AC
  Administered 2019-04-10: 12:00:00 40 mg via SUBCUTANEOUS
  Filled 2019-04-10: qty 0.4

## 2019-04-10 MED ORDER — PROMETHAZINE HCL 25 MG/ML IJ SOLN
6.2500 mg | Freq: Once | INTRAMUSCULAR | Status: AC
  Administered 2019-04-10: 10:00:00 6.25 mg via INTRAVENOUS
  Filled 2019-04-10: qty 1

## 2019-04-10 MED ORDER — POLYETHYLENE GLYCOL 3350 17 G PO PACK
17.0000 g | PACK | Freq: Every day | ORAL | Status: DC
  Administered 2019-04-10: 21:00:00 17 g via ORAL
  Filled 2019-04-10 (×2): qty 1

## 2019-04-10 MED ORDER — ONDANSETRON HCL 4 MG/2ML IJ SOLN
4.0000 mg | Freq: Four times a day (QID) | INTRAMUSCULAR | Status: DC | PRN
  Administered 2019-04-11: 11:00:00 4 mg via INTRAVENOUS
  Filled 2019-04-10: qty 2

## 2019-04-10 MED ORDER — POTASSIUM CHLORIDE IN NACL 20-0.9 MEQ/L-% IV SOLN
INTRAVENOUS | Status: DC
  Filled 2019-04-10 (×3): qty 1000

## 2019-04-10 MED ORDER — FAMOTIDINE 20 MG PO TABS
20.0000 mg | ORAL_TABLET | Freq: Every day | ORAL | Status: DC
  Administered 2019-04-10: 12:00:00 20 mg via ORAL
  Filled 2019-04-10: qty 1

## 2019-04-10 MED ORDER — MORPHINE SULFATE (PF) 2 MG/ML IV SOLN: 2.0000 mg | INTRAVENOUS | Status: DC | PRN

## 2019-04-10 MED ORDER — FENTANYL CITRATE (PF) 100 MCG/2ML IJ SOLN
100.0000 ug | Freq: Once | INTRAMUSCULAR | Status: AC
  Administered 2019-04-10: 08:00:00 100 ug via INTRAVENOUS
  Filled 2019-04-10: qty 2

## 2019-04-10 MED ORDER — DIPHENHYDRAMINE HCL 12.5 MG/5ML PO ELIX: 12.5000 mg | ORAL_SOLUTION | Freq: Four times a day (QID) | ORAL | Status: DC | PRN

## 2019-04-10 MED ORDER — ONDANSETRON HCL 4 MG/2ML IJ SOLN
4.0000 mg | Freq: Once | INTRAMUSCULAR | Status: AC
  Administered 2019-04-10: 08:00:00 4 mg via INTRAVENOUS
  Filled 2019-04-10: qty 2

## 2019-04-10 MED ORDER — DIPHENHYDRAMINE HCL 50 MG/ML IJ SOLN: 12.5000 mg | Freq: Four times a day (QID) | INTRAMUSCULAR | Status: DC | PRN

## 2019-04-10 MED ORDER — SODIUM CHLORIDE 0.9 % IV BOLUS
500.0000 mL | Freq: Once | INTRAVENOUS | Status: AC
  Administered 2019-04-10: 08:00:00 500 mL via INTRAVENOUS

## 2019-04-10 MED ORDER — ACETAMINOPHEN 650 MG RE SUPP: 650.0000 mg | Freq: Four times a day (QID) | RECTAL | Status: DC | PRN

## 2019-04-10 MED ORDER — ACETAMINOPHEN 325 MG PO TABS: 650.0000 mg | ORAL_TABLET | Freq: Four times a day (QID) | ORAL | Status: DC | PRN

## 2019-04-10 NOTE — ED Provider Notes (Signed)
[facility] EMERGENCY DEPARTMENT Provider Note   CSN: [id number] Arrival date & time: 04/10/19  0701     History Chief Complaint  Patient presents with  . Abdominal Pain    Miranda Wells is a 64 y.o. female.  HPI Patient presents with abdominal pain.  5 days ago seen in the ER for abdominal pain diagnosed with biliary dyskinesia after delayed drainage on the HIDA scan.  Has had continued pain.  Has had decreased oral intake due to the pain.  Has had some constipation also.  Seen by PCP yesterday and had x-ray done.  Did have some stool throughout colon but otherwise benign x-ray.  Pain worsened last night.  Unrelieved by oxycodone.  She was due to see Dr. Doylene Canard at 130 today to discuss cholecystectomy.  Did not have breakfast today.  Continued pain.  States she has had some chills with it.    Past Medical History:  Diagnosis Date  . GERD (gastroesophageal reflux disease)   . Hypercholesteremia   . Hypertension     Patient Active Problem List   Diagnosis Date Noted  . Biliary dyskinesia 04/10/2019    Past Surgical History:  Procedure Laterality Date  . TUBAL LIGATION       OB History   No obstetric history on file.     No family history on file.  Social History   Tobacco Use  . Smoking status: Former Games developer  . Smokeless tobacco: Former Engineer, water Use Topics  . Alcohol use: Never  . Drug use: Never    Home Medications Prior to Admission medications   Medication Sig Start Date End Date Taking? Authorizing Provider  amLODipine (NORVASC) 10 MG tablet Take 10 mg by mouth daily.  03/27/18   [provider]  Ascorbic Acid (VITAMIN C PO) Take 1 tablet by mouth daily.     [provider]  atorvastatin (LIPITOR) 40 MG tablet Take 40 mg by mouth daily at 6 PM.  04/24/18   [provider]  CALCIUM PO Take 1 tablet by mouth daily.     [provider]  Cholecalciferol (VITAMIN D3 PO) Take 1 tablet by mouth daily.      [provider]  cloNIDine (CATAPRES) 0.1 MG tablet Take 0.2 mg by mouth daily.  03/27/18   [provider]  famciclovir (FAMVIR) 125 MG tablet TK 1 T PO BID FOR 5 DAYS 12/22/17   [provider]  hydrochlorothiazide (HYDRODIURIL) 25 MG tablet Take 25 mg by mouth daily.  03/27/18   [provider]  losartan (COZAAR) 100 MG tablet Take 150 mg by mouth daily.  04/26/18   [provider]  metoprolol succinate (TOPROL-XL) 25 MG 24 hr tablet Take 25 mg by mouth daily. 03/04/19   [provider]  neomycin-polymyxin-hydrocortisone (CORTISPORIN) OTIC solution Apply 1-2 drops to toe after soaking twice a day Patient not taking: Reported on 04/05/2019 05/18/18   Lenn Sink, DPM  omeprazole (PRILOSEC) 40 MG capsule Take 1 capsule (40 mg total) by mouth daily. 04/05/19   Maczis, Elmer Sow, PA-C  ondansetron (ZOFRAN) 4 MG tablet Take 1 tablet (4 mg total) by mouth every 6 (six) hours as needed for nausea or vomiting. 04/05/19   Maczis, Elmer Sow, PA-C  oxyCODONE (ROXICODONE) 5 MG immediate release tablet Take 1 tablet (5 mg total) by mouth every 6 (six) hours as needed. 04/05/19   Maczis, Elmer Sow, PA-C  potassium chloride (KLOR-CON) 10 MEQ tablet Take  10 mEq by mouth daily. 03/26/19   [provider]  raloxifene (EVISTA) 60 MG tablet Take 60 mg by mouth daily.  04/24/18   [provider]    Allergies    Ace inhibitors  Review of Systems   Review of Systems  Constitutional: Positive for appetite change and chills.  HENT: Negative for congestion.   Respiratory: Negative for shortness of breath.   Cardiovascular: Negative for chest pain.  Gastrointestinal: Positive for abdominal pain, constipation and nausea.  Genitourinary: Negative for flank pain.  Musculoskeletal: Negative for back pain.  Skin: Negative for rash.  Neurological: Negative for weakness.  Psychiatric/Behavioral: Negative for confusion.    Physical Exam Updated  Vital Signs BP (!) 156/65   Pulse (!) 58   Temp 97.8 F (36.6 C) (Oral)   Resp 16   Ht 5\' 2"  (1.575 m)   Wt 72.6 kg   SpO2 (!) 88%   BMI 29.26 kg/m   Physical Exam Vitals and nursing note reviewed.  Constitutional:      Comments: Patient appears uncomfortable  HENT:     Head: Normocephalic.  Cardiovascular:     Rate and Rhythm: Regular rhythm.  Pulmonary:     Effort: Pulmonary effort is normal.     Breath sounds: Normal breath sounds.  Abdominal:     Comments: Mild abdominal tenderness without rebound or guarding.  No hernia.  Skin:    General: Skin is warm.     Capillary Refill: Capillary refill takes less than 2 seconds.  Neurological:     Mental Status: She is alert.  Psychiatric:        Mood and Affect: Mood normal.     ED Results / Procedures / Treatments   Labs (all labs ordered are listed, but only abnormal results are displayed) Labs Reviewed  COMPREHENSIVE METABOLIC PANEL - Abnormal; Notable for the following components:      Result Value   Sodium 127 (*)    Potassium 3.0 (*)    Chloride 86 (*)    Glucose, Bld 111 (*)    Creatinine, Ser 1.74 (*)    GFR calc non Af Amer 30 (*)    GFR calc Af Amer 35 (*)    All other components within normal limits  RESPIRATORY PANEL BY RT PCR (FLU A&B, COVID)  CBC WITH DIFFERENTIAL/PLATELET  LIPASE, BLOOD  HIV ANTIBODY (ROUTINE TESTING W REFLEX)  MAGNESIUM    EKG EKG Interpretation  Date/Time:  Wednesday April 10 2019 07:12:03 EST Ventricular Rate:  63 PR Interval:    QRS Duration: 109 QT Interval:  414 QTC Calculation: 424 R Axis:   35 Text Interpretation: Sinus rhythm RSR' in V1 or V2, probably normal variant Borderline repolarization abnormality Confirmed by Davonna Belling [phone]) on 04/10/2019 8:24:34 AM   Radiology DG Abd 1 View  Result Date: 04/09/2019 CLINICAL DATA:  Abdominal pain. EXAM: ABDOMEN - 1 VIEW COMPARISON:  CT 04/05/2019. FINDINGS: Radiopacities noted over the abdomen and pelvis may  represent undigested pill fragments. Nondistended air-filled loops of small and large bowel noted. Stool noted throughout the colon and rectum. No free air. No acute bony abnormality identified. IMPRESSION: No acute abnormality identified. Electronically Signed   By: Marcello Moores  Register   On: 04/09/2019 10:00   US Abdomen Limited RUQ  Result Date: 04/10/2019 CLINICAL DATA:  Upper abdominal pain EXAM: ULTRASOUND ABDOMEN LIMITED RIGHT UPPER QUADRANT COMPARISON:  None. FINDINGS: Gallbladder: Within the gallbladder, there is a 6 mm echogenic focus which neither moves nor  shadows, a presumed polyp. No evident gallstones, gallbladder wall thickening, or pericholecystic fluid. No sonographic Murphy sign noted by sonographer. Common bile duct: Diameter: 5 mm. No intrahepatic or extrahepatic biliary duct dilatation. Liver: No focal lesion identified. Within normal limits in parenchymal echogenicity. Portal vein is patent on color Doppler imaging with normal direction of blood flow towards the liver. Other: None. IMPRESSION: 1. 6 mm apparent gallbladder polyp. Per consensus guidelines, a polyp of this size does not warrant additional imaging surveillance. No gallstones, gallbladder wall thickening, or pericholecystic fluid. 2.  Study otherwise unremarkable. Electronically Signed   By: Bretta Bang III M.D.   On: 04/10/2019 08:47    Procedures Procedures (including critical care time)  Medications Ordered in ED Medications  0.9 % NaCl with KCl 20 mEq/ L  infusion ( Intravenous New Bag/Given 04/10/19 1006)  cefTRIAXone (ROCEPHIN) 2 g in sodium chloride 0.9 % 100 mL IVPB (has no administration in time range)  acetaminophen (TYLENOL) tablet 650 mg (has no administration in time range)    Or  acetaminophen (TYLENOL) suppository 650 mg (has no administration in time range)  oxyCODONE (Oxy IR/ROXICODONE) immediate release tablet 5-10 mg (has no administration in time range)  morphine 2 MG/ML injection 2 mg (has no  administration in time range)  diphenhydrAMINE (BENADRYL) 12.5 MG/5ML elixir 12.5 mg (has no administration in time range)    Or  diphenhydrAMINE (BENADRYL) injection 12.5 mg (has no administration in time range)  ondansetron (ZOFRAN-ODT) disintegrating tablet 4 mg (has no administration in time range)    Or  ondansetron (ZOFRAN) injection 4 mg (has no administration in time range)  metoprolol succinate (TOPROL-XL) 24 hr tablet 25 mg (has no administration in time range)  famotidine (PEPCID) tablet 20 mg (has no administration in time range)  enoxaparin (LOVENOX) injection 40 mg (has no administration in time range)  sodium chloride 0.9 % bolus 500 mL (0 mLs Intravenous Stopped 04/10/19 1007)  ondansetron (ZOFRAN) injection 4 mg (4 mg Intravenous Given 04/10/19 0755)  fentaNYL (SUBLIMAZE) injection 100 mcg (100 mcg Intravenous Given 04/10/19 0755)  lactated ringers bolus 1,000 mL (1,000 mLs Intravenous New Bag/Given 04/10/19 0954)  promethazine (PHENERGAN) injection 6.25 mg (6.25 mg Intravenous Given 04/10/19 0944)    ED Course  I have reviewed the triage vital signs and the nursing notes.  Pertinent labs & imaging results that were available during my care of the patient were reviewed by me and considered in my medical decision making (see chart for details).    MDM Rules/Calculators/A&P                     Patient with diagnosed biliary dyskinesia previously.  Had been planning outpatient follow-up but doing worse over the last few days.  Has bumped her creatinine due to decreased oral intake.  Ultrasound reassuring.  Seen by surgery and will admit Final Clinical Impression(s) / ED Diagnoses Final diagnoses:  Abdominal pain, unspecified abdominal location  Biliary dyskinesia  Dehydration    Rx / DC Orders ED Discharge Orders    None       Benjiman Core, MD 04/10/19 1137

## 2019-04-10 NOTE — ED Notes (Signed)
Pt assisted to bathroom.  States pain and nausea greatly improved.

## 2019-04-10 NOTE — H&P (Signed)
[facility] Surgery Admission Note  Miranda Wells 17-Feb-1956  [id number].    Requesting MD: Benjiman Core Chief Complaint: Abdominal pain Reason for Consult: Biliary dyskinesia  HPI: Patient is a 64 year old female was seen 5 days ago and diagnosed with biliary dyskinesia based on a HIDA scan that showed an ejection fraction of 26%.  Plans were made for her to follow-up as an outpatient she actually has an appointment today with Dr. Doylene Canard.  She says she has not been able to take anything orally except some liquids and has continued abdominal pain and constipation.  She has not had a bowel movement since last Tuesday.  She was seen by her primary care yesterday x-ray did show some colon throughout the stool.  Pain became worse last evening and she presents to the ED again today.  Work-up in the ED shows she is afebrile vital signs are stable.  Sodium is 127, potassium is 3, creatinine up to 1.74, LFTs are normal.  Lipase is 21. WBC 9.8, hemoglobin 13.3, hematocrit 37, platelets 282,000 Covid panel is pending.  Right upper quadrant ultrasound shows what appears to be a 6 mm polyp no evident gallstones, gallbladder wall thickening, or pericholecystic fluid no Murphy sign.  CBD is 5 mm.  We are asked to see.    ROS:  Review of Systems  Constitutional: Negative.   HENT: Negative.   Eyes: Negative.   Respiratory: Negative.   Cardiovascular: Negative.   Gastrointestinal: Positive for abdominal pain, constipation (last BM 1/19), heartburn and nausea. Negative for blood in stool, diarrhea, melena and vomiting.       Colonoscopy 3 years ago she does not remember by whom  Genitourinary: Negative.   Musculoskeletal: Negative.   Skin: Negative.   Neurological: Negative.   Endo/Heme/Allergies: Negative.   Psychiatric/Behavioral: Negative.     No family history on file.  Past Medical History:  Diagnosis Date  . GERD (gastroesophageal reflux disease)   . Hypercholesteremia   .  Hypertension     Past Surgical History:  Procedure Laterality Date  . TUBAL LIGATION      Social History:  reports that she has quit smoking. She has quit using smokeless tobacco. She reports that she does not drink alcohol or use drugs.  Just retired as a Psychologist, occupational.  She lives with her husband. EtOH: None Drugs: None Tobacco a pack a day x10 years none x30 years.     Allergies:  Allergies  Allergen Reactions  . Ace Inhibitors Cough    Prior to Admission medications   Medication Sig Start Date End Date Taking? Authorizing Provider  amLODipine (NORVASC) 10 MG tablet Take 10 mg by mouth daily.  03/27/18   [provider]  Ascorbic Acid (VITAMIN C PO) Take 1 tablet by mouth daily.     [provider]  atorvastatin (LIPITOR) 40 MG tablet Take 40 mg by mouth daily at 6 PM.  04/24/18   [provider]  CALCIUM PO Take 1 tablet by mouth daily.     [provider]  Cholecalciferol (VITAMIN D3 PO) Take 1 tablet by mouth daily.     [provider]  cloNIDine (CATAPRES) 0.1 MG tablet Take 0.2 mg by mouth daily.  03/27/18   [provider]  famciclovir (FAMVIR) 125 MG tablet TK 1 T PO BID FOR 5 DAYS 12/22/17   [provider]  hydrochlorothiazide (HYDRODIURIL) 25 MG tablet Take 25 mg by mouth daily.  03/27/18   [provider]  losartan (  COZAAR) 100 MG tablet Take 150 mg by mouth daily.  04/26/18   [provider]  metoprolol succinate (TOPROL-XL) 25 MG 24 hr tablet Take 25 mg by mouth daily. 03/04/19   [provider]  neomycin-polymyxin-hydrocortisone (CORTISPORIN) OTIC solution Apply 1-2 drops to toe after soaking twice a day Patient not taking: Reported on 04/05/2019 05/18/18   Lenn Sink, DPM  omeprazole (PRILOSEC) 40 MG capsule Take 1 capsule (40 mg total) by mouth daily. 04/05/19   Maczis, Elmer Sow, PA-C  ondansetron (ZOFRAN) 4 MG tablet Take 1 tablet (4 mg total) by mouth every 6 (six) hours as  needed for nausea or vomiting. 04/05/19   Maczis, Elmer Sow, PA-C  oxyCODONE (ROXICODONE) 5 MG immediate release tablet Take 1 tablet (5 mg total) by mouth every 6 (six) hours as needed. 04/05/19   Maczis, Elmer Sow, PA-C  potassium chloride (KLOR-CON) 10 MEQ tablet Take 10 mEq by mouth daily. 03/26/19   [provider]  raloxifene (EVISTA) 60 MG tablet Take 60 mg by mouth daily.  04/24/18   [provider]      Blood pressure (!) 162/57, pulse 63, temperature 97.8 F (36.6 C), temperature source Oral, resp. rate 19, height 5\' 2"  (1.575 m), weight 72.6 kg, SpO2 100 %. Physical Exam: Physical Exam Constitutional:      General: She is not in acute distress.    Appearance: She is well-developed and normal weight. She is not ill-appearing, toxic-appearing or diaphoretic.  HENT:     Head: Normocephalic and atraumatic.     Mouth/Throat:     Mouth: Mucous membranes are moist.  Eyes:     General: No scleral icterus.    Comments: Pupils are equal  Cardiovascular:     Rate and Rhythm: Normal rate and regular rhythm.     Heart sounds: No murmur.  Pulmonary:     Effort: Pulmonary effort is normal.     Breath sounds: Normal breath sounds.  Abdominal:     General: Abdomen is flat. Bowel sounds are normal. There is no distension or abdominal bruit. There are no signs of injury.     Palpations: Abdomen is soft.     Tenderness: There is abdominal tenderness in the right upper quadrant.     Hernia: No hernia is present.  Skin:    General: Skin is warm and dry.     Capillary Refill: Capillary refill takes less than 2 seconds.  Neurological:     General: No focal deficit present.     Mental Status: She is alert and oriented to person, place, and time.     Cranial Nerves: No cranial nerve deficit.  Psychiatric:        Mood and Affect: Mood normal. Mood is not anxious or depressed.        Behavior: Behavior normal.     Results for orders placed or performed during the hospital  encounter of 04/10/19 (from the past 48 hour(s))  CBC with Differential     Status: None   Collection Time: 04/10/19  7:26 AM  Result Value Ref Range   WBC 9.8 4.0 - 10.5 K/uL   RBC 4.63 3.87 - 5.11 MIL/uL   Hemoglobin 13.3 12.0 - 15.0 g/dL   HCT 04/12/19 35.0 - 09.3 %   MCV 81.6 80.0 - 100.0 fL   MCH 28.7 26.0 - 34.0 pg   MCHC 35.2 30.0 - 36.0 g/dL   RDW 81.8 29.9 - 37.1 %   Platelets 282 150 -  400 K/uL   nRBC 0.0 0.0 - 0.2 %   Neutrophils Relative % 78 %   Neutro Abs 7.6 1.7 - 7.7 K/uL   Lymphocytes Relative 13 %   Lymphs Abs 1.3 0.7 - 4.0 K/uL   Monocytes Relative 9 %   Monocytes Absolute 0.9 0.1 - 1.0 K/uL   Eosinophils Relative 0 %   Eosinophils Absolute 0.0 0.0 - 0.5 K/uL   Basophils Relative 0 %   Basophils Absolute 0.0 0.0 - 0.1 K/uL   Immature Granulocytes 0 %   Abs Immature Granulocytes 0.03 0.00 - 0.07 K/uL    Comment: Performed at [facility] [address], [place], [place] [address]  Comprehensive metabolic panel     Status: Abnormal   Collection Time: 04/10/19  7:26 AM  Result Value Ref Range   Sodium 127 (L) 135 - 145 mmol/L   Potassium 3.0 (L) 3.5 - 5.1 mmol/L   Chloride 86 (L) 98 - 111 mmol/L   CO2 26 22 - 32 mmol/L   Glucose, Bld 111 (H) 70 - 99 mg/dL   BUN 15 8 - 23 mg/dL   Creatinine, Ser 1.74 (H) 0.44 - 1.00 mg/dL   Calcium 9.7 8.9 - 10.3 mg/dL   Total Protein 7.1 6.5 - 8.1 g/dL   Albumin 4.2 3.5 - 5.0 g/dL   AST 26 15 - 41 U/L   ALT 17 0 - 44 U/L   Alkaline Phosphatase 66 38 - 126 U/L   Total Bilirubin 1.0 0.3 - 1.2 mg/dL   GFR calc non Af Amer 30 (L) >60 mL/min   GFR calc Af Amer 35 (L) >60 mL/min   Anion gap 15 5 - 15    Comment: Performed at [facility], [place] [address] [place], [place] [address]  Lipase, blood     Status: None   Collection Time: 04/10/19  7:26 AM  Result Value Ref Range   Lipase 21 11 - 51 U/L    Comment: Performed at [facility] [address] [place], [place] [address]   DG Abd 1 View  Result  Date: 04/09/2019 CLINICAL DATA:  Abdominal pain. EXAM: ABDOMEN - 1 VIEW COMPARISON:  CT 04/05/2019. FINDINGS: Radiopacities noted over the abdomen and pelvis may represent undigested pill fragments. Nondistended air-filled loops of small and large bowel noted. Stool noted throughout the colon and rectum. No free air. No acute bony abnormality identified. IMPRESSION: No acute abnormality identified. Electronically Signed   By: Marcello Moores  Register   On: 04/09/2019 10:00   US Abdomen Limited RUQ  Result Date: 04/10/2019 CLINICAL DATA:  Upper abdominal pain EXAM: ULTRASOUND ABDOMEN LIMITED RIGHT UPPER QUADRANT COMPARISON:  None. FINDINGS: Gallbladder: Within the gallbladder, there is a 6 mm echogenic focus which neither moves nor shadows, a presumed polyp. No evident gallstones, gallbladder wall thickening, or pericholecystic fluid. No sonographic Murphy sign noted by sonographer. Common bile duct: Diameter: 5 mm. No intrahepatic or extrahepatic biliary duct dilatation. Liver: No focal lesion identified. Within normal limits in parenchymal echogenicity. Portal vein is patent on color Doppler imaging with normal direction of blood flow towards the liver. Other: None. IMPRESSION: 1. 6 mm apparent gallbladder polyp. Per consensus guidelines, a polyp of this size does not warrant additional imaging surveillance. No gallstones, gallbladder wall thickening, or pericholecystic fluid. 2.  Study otherwise unremarkable. Electronically Signed   By: Lowella Grip III M.D.   On: 04/10/2019 08:47      Assessment/Plan Hypertension GERD Hyperlipidemia  Constipation  Recurrent abdominal pain Biliary dyskinesia -HIDA 04/05/2019 AKI/dehydration Hyponatremia/hypokalemia  FEN: Clear liquids/IV fluids ID: Rocephin DVT: Lovenox per pharmacy Follow-up: Clinic  Plan: We are going to admit the patient place her on IV fluids and clear liquids for today try to correct her electrolytes and renal function.  We will recheck  her labs tomorrow and aim for laparoscopic cholecystectomy tomorrow.  Patient was seen and examined by Dr. Janee Morn, he explained the risk and benefits involved along with our plan of treatment.  Patient is in agreement and says she would discuss it with her husband also.       Sherrie George [facility] Surgery 04/10/2019, 9:05 AM Please see Amion for pager number during day hours 7:00am-4:30pm

## 2019-04-10 NOTE — ED Triage Notes (Signed)
Pt was d/t meet with surgeon today to discuss removing gallbladder.   Abdominal pain began last night and was unrelieved with oxycodone.

## 2019-04-11 ENCOUNTER — Inpatient Hospital Stay (HOSPITAL_COMMUNITY): Payer: BC Managed Care – PPO | Admitting: Critical Care Medicine

## 2019-04-11 ENCOUNTER — Encounter (HOSPITAL_COMMUNITY): Payer: Self-pay

## 2019-04-11 ENCOUNTER — Encounter (HOSPITAL_COMMUNITY): Admission: EM | Disposition: A | Discharge status: Home / Self Care | Payer: Self-pay | Source: Home / Self Care

## 2019-04-11 DIAGNOSIS — K828 Other specified diseases of gallbladder: Secondary | ICD-10-CM | POA: Diagnosis not present

## 2019-04-11 DIAGNOSIS — K219 Gastro-esophageal reflux disease without esophagitis: Secondary | ICD-10-CM | POA: Diagnosis not present

## 2019-04-11 DIAGNOSIS — I10 Essential (primary) hypertension: Secondary | ICD-10-CM | POA: Diagnosis not present

## 2019-04-11 DIAGNOSIS — K811 Chronic cholecystitis: Secondary | ICD-10-CM | POA: Diagnosis not present

## 2019-04-11 HISTORY — PX: CHOLECYSTECTOMY: SHX55

## 2019-04-11 LAB — CBC
HCT: 30.9 % — ABNORMAL LOW (ref 36.0–46.0)
Hemoglobin: 10.8 g/dL — ABNORMAL LOW (ref 12.0–15.0)
MCH: 29.6 pg (ref 26.0–34.0)
MCHC: 35 g/dL (ref 30.0–36.0)
MCV: 84.7 fL (ref 80.0–100.0)
Platelets: 202 10*3/uL (ref 150–400)
RBC: 3.65 MIL/uL — ABNORMAL LOW (ref 3.87–5.11)
RDW: 13.6 % (ref 11.5–15.5)
WBC: 7.7 10*3/uL (ref 4.0–10.5)
nRBC: 0 % (ref 0.0–0.2)

## 2019-04-11 LAB — COMPREHENSIVE METABOLIC PANEL
ALT: 14 U/L (ref 0–44)
AST: 19 U/L (ref 15–41)
Albumin: 3.1 g/dL — ABNORMAL LOW (ref 3.5–5.0)
Alkaline Phosphatase: 49 U/L (ref 38–126)
Anion gap: 11 (ref 5–15)
BUN: 9 mg/dL (ref 8–23)
CO2: 25 mmol/L (ref 22–32)
Calcium: 8.6 mg/dL — ABNORMAL LOW (ref 8.9–10.3)
Chloride: 102 mmol/L (ref 98–111)
Creatinine, Ser: 1.06 mg/dL — ABNORMAL HIGH (ref 0.44–1.00)
GFR calc Af Amer: >60 mL/min (ref >60)
GFR calc non Af Amer: 55 mL/min — ABNORMAL LOW (ref >60)
Glucose, Bld: 85 mg/dL (ref 70–99)
Potassium: 3.2 mmol/L — ABNORMAL LOW (ref 3.5–5.1)
Sodium: 138 mmol/L (ref 135–145)
Total Bilirubin: 0.5 mg/dL (ref 0.3–1.2)
Total Protein: 5.4 g/dL — ABNORMAL LOW (ref 6.5–8.1)

## 2019-04-11 SURGERY — LAPAROSCOPIC CHOLECYSTECTOMY
Anesthesia: General

## 2019-04-11 MED ORDER — LIDOCAINE 2% (20 MG/ML) 5 ML SYRINGE
INTRAMUSCULAR | Status: DC | PRN
  Administered 2019-04-11: 100 mg via INTRAVENOUS

## 2019-04-11 MED ORDER — FENTANYL CITRATE (PF) 250 MCG/5ML IJ SOLN
INTRAMUSCULAR | Status: AC
  Filled 2019-04-11: qty 5

## 2019-04-11 MED ORDER — BUPIVACAINE HCL (PF) 0.25 % IJ SOLN
INTRAMUSCULAR | Status: AC
  Filled 2019-04-11: qty 30

## 2019-04-11 MED ORDER — 0.9 % SODIUM CHLORIDE (POUR BTL) OPTIME
TOPICAL | Status: DC | PRN
  Administered 2019-04-11: 1000 mL

## 2019-04-11 MED ORDER — FENTANYL CITRATE (PF) 250 MCG/5ML IJ SOLN
INTRAMUSCULAR | Status: DC | PRN
  Administered 2019-04-11 (×5): 50 ug via INTRAVENOUS
  Administered 2019-04-11: 25 ug via INTRAVENOUS

## 2019-04-11 MED ORDER — ROCURONIUM BROMIDE 10 MG/ML (PF) SYRINGE
PREFILLED_SYRINGE | INTRAVENOUS | Status: DC | PRN
  Administered 2019-04-11: 50 mg via INTRAVENOUS

## 2019-04-11 MED ORDER — HYDROMORPHONE HCL 1 MG/ML IJ SOLN: 0.2500 mg | INTRAMUSCULAR | Status: DC | PRN

## 2019-04-11 MED ORDER — ACETAMINOPHEN 325 MG PO TABS: ORAL_TABLET | ORAL | Status: AC

## 2019-04-11 MED ORDER — LACTATED RINGERS IV SOLN: INTRAVENOUS | Status: DC

## 2019-04-11 MED ORDER — PROMETHAZINE HCL 25 MG/ML IJ SOLN
INTRAMUSCULAR | Status: AC
  Filled 2019-04-11: qty 1

## 2019-04-11 MED ORDER — MIDAZOLAM HCL 5 MG/5ML IJ SOLN
INTRAMUSCULAR | Status: DC | PRN
  Administered 2019-04-11: 2 mg via INTRAVENOUS

## 2019-04-11 MED ORDER — PHENYLEPHRINE 40 MCG/ML (10ML) SYRINGE FOR IV PUSH (FOR BLOOD PRESSURE SUPPORT)
PREFILLED_SYRINGE | INTRAVENOUS | Status: DC | PRN
  Administered 2019-04-11: 40 ug via INTRAVENOUS

## 2019-04-11 MED ORDER — PROPOFOL 10 MG/ML IV BOLUS
INTRAVENOUS | Status: DC | PRN
  Administered 2019-04-11: 20 mg via INTRAVENOUS
  Administered 2019-04-11: 150 mg via INTRAVENOUS
  Administered 2019-04-11: 10 mg via INTRAVENOUS

## 2019-04-11 MED ORDER — DEXAMETHASONE SODIUM PHOSPHATE 10 MG/ML IJ SOLN
INTRAMUSCULAR | Status: DC | PRN
  Administered 2019-04-11: 4 mg via INTRAVENOUS

## 2019-04-11 MED ORDER — OXYCODONE HCL 5 MG PO TABS: 5.0000 mg | ORAL_TABLET | ORAL | 0 refills | Status: DC | PRN

## 2019-04-11 MED ORDER — ONDANSETRON HCL 4 MG/2ML IJ SOLN
INTRAMUSCULAR | Status: DC | PRN
  Administered 2019-04-11: 4 mg via INTRAVENOUS

## 2019-04-11 MED ORDER — MEPERIDINE HCL 25 MG/ML IJ SOLN: 6.2500 mg | INTRAMUSCULAR | Status: DC | PRN

## 2019-04-11 MED ORDER — BUPIVACAINE HCL (PF) 0.25 % IJ SOLN
INTRAMUSCULAR | Status: DC | PRN
  Administered 2019-04-11: 20 mL

## 2019-04-11 MED ORDER — SUGAMMADEX SODIUM 200 MG/2ML IV SOLN
INTRAVENOUS | Status: DC | PRN
  Administered 2019-04-11: 150 mg via INTRAVENOUS

## 2019-04-11 MED ORDER — PROPOFOL 10 MG/ML IV BOLUS
INTRAVENOUS | Status: AC
  Filled 2019-04-11: qty 20

## 2019-04-11 MED ORDER — PROMETHAZINE HCL 25 MG/ML IJ SOLN
6.2500 mg | INTRAMUSCULAR | Status: DC | PRN
  Administered 2019-04-11: 11:00:00 6.25 mg via INTRAVENOUS

## 2019-04-11 MED ORDER — MIDAZOLAM HCL 2 MG/2ML IJ SOLN
INTRAMUSCULAR | Status: AC
  Filled 2019-04-11: qty 2

## 2019-04-11 MED ORDER — POTASSIUM CHLORIDE 10 MEQ/100ML IV SOLN
INTRAVENOUS | Status: AC
  Filled 2019-04-11: qty 100

## 2019-04-11 MED ORDER — POTASSIUM CHLORIDE 10 MEQ/100ML IV SOLN: 10.0000 meq | INTRAVENOUS | Status: DC

## 2019-04-11 MED ORDER — POTASSIUM CHLORIDE 10 MEQ/100ML IV SOLN: 10.0000 meq | INTRAVENOUS | Status: AC

## 2019-04-11 MED ORDER — SODIUM CHLORIDE 0.9 % IR SOLN
Status: DC | PRN
  Administered 2019-04-11: 1000 mL

## 2019-04-11 SURGICAL SUPPLY — 44 items
APPLIER CLIP 5 13 M/L LIGAMAX5 (MISCELLANEOUS) ×2
BLADE CLIPPER SURG (BLADE) IMPLANT
CANISTER SUCT 3000ML PPV (MISCELLANEOUS) ×2 IMPLANT
CHLORAPREP W/TINT 26 (MISCELLANEOUS) ×2 IMPLANT
CLIP APPLIE 5 13 M/L LIGAMAX5 (MISCELLANEOUS) ×1 IMPLANT
COVER MAYO STAND STRL (DRAPES) IMPLANT
COVER SURGICAL LIGHT HANDLE (MISCELLANEOUS) ×2 IMPLANT
COVER WAND RF STERILE (DRAPES) IMPLANT
DERMABOND ADVANCED (GAUZE/BANDAGES/DRESSINGS) ×1
DERMABOND ADVANCED .7 DNX12 (GAUZE/BANDAGES/DRESSINGS) ×1 IMPLANT
DRAPE C-ARM 42X120 X-RAY (DRAPES) IMPLANT
ELECT REM PT RETURN 9FT ADLT (ELECTROSURGICAL) ×2
ELECTRODE REM PT RTRN 9FT ADLT (ELECTROSURGICAL) ×1 IMPLANT
FILTER SMOKE EVAC LAPAROSHD (FILTER) IMPLANT
GLOVE BIO SURGEON STRL SZ8 (GLOVE) ×2 IMPLANT
GLOVE BIOGEL PI IND STRL 8 (GLOVE) ×1 IMPLANT
GLOVE BIOGEL PI INDICATOR 8 (GLOVE) ×1
GOWN STRL REUS W/ TWL LRG LVL3 (GOWN DISPOSABLE) ×2 IMPLANT
GOWN STRL REUS W/ TWL XL LVL3 (GOWN DISPOSABLE) ×1 IMPLANT
GOWN STRL REUS W/TWL LRG LVL3 (GOWN DISPOSABLE) ×2
GOWN STRL REUS W/TWL XL LVL3 (GOWN DISPOSABLE) ×1
KIT BASIN OR (CUSTOM PROCEDURE TRAY) ×2 IMPLANT
KIT TURNOVER KIT B (KITS) ×2 IMPLANT
L-HOOK LAP DISP 36CM (ELECTROSURGICAL) ×2
LHOOK LAP DISP 36CM (ELECTROSURGICAL) ×1 IMPLANT
NEEDLE 22X1 1/2 (OR ONLY) (NEEDLE) ×2 IMPLANT
NS IRRIG 1000ML POUR BTL (IV SOLUTION) ×2 IMPLANT
PAD ARMBOARD 7.5X6 YLW CONV (MISCELLANEOUS) ×2 IMPLANT
PENCIL BUTTON HOLSTER BLD 10FT (ELECTRODE) ×2 IMPLANT
POUCH RETRIEVAL ECOSAC 10 (ENDOMECHANICALS) ×1 IMPLANT
POUCH RETRIEVAL ECOSAC 10MM (ENDOMECHANICALS) ×1
SCISSORS LAP 5X35 DISP (ENDOMECHANICALS) ×2 IMPLANT
SET CHOLANGIOGRAPH 5 50 .035 (SET/KITS/TRAYS/PACK) IMPLANT
SET IRRIG TUBING LAPAROSCOPIC (IRRIGATION / IRRIGATOR) ×2 IMPLANT
SET TUBE SMOKE EVAC HIGH FLOW (TUBING) ×2 IMPLANT
SLEEVE ENDOPATH XCEL 5M (ENDOMECHANICALS) ×4 IMPLANT
SPECIMEN JAR SMALL (MISCELLANEOUS) ×2 IMPLANT
SUT VIC AB 4-0 PS2 27 (SUTURE) ×2 IMPLANT
TOWEL GREEN STERILE (TOWEL DISPOSABLE) ×2 IMPLANT
TOWEL GREEN STERILE FF (TOWEL DISPOSABLE) ×2 IMPLANT
TRAY LAPAROSCOPIC MC (CUSTOM PROCEDURE TRAY) ×2 IMPLANT
TROCAR XCEL BLUNT TIP 100MML (ENDOMECHANICALS) ×2 IMPLANT
TROCAR XCEL NON-BLD 5MMX100MML (ENDOMECHANICALS) ×2 IMPLANT
WATER STERILE IRR 1000ML POUR (IV SOLUTION) ×2 IMPLANT

## 2019-04-11 NOTE — Anesthesia Procedure Notes (Signed)
Procedure Name: Intubation Date/Time: 04/11/2019 9:07 AM Performed by: Wilburn Cornelia, CRNA Pre-anesthesia Checklist: Patient identified, Emergency Drugs available, Suction available, Patient being monitored and Timeout performed Patient Re-evaluated:Patient Re-evaluated prior to induction Oxygen Delivery Method: Circle system utilized Preoxygenation: Pre-oxygenation with 100% oxygen Induction Type: IV induction Ventilation: Mask ventilation without difficulty Laryngoscope Size: Mac and 3 Grade View: Grade II Tube type: Oral Tube size: 7.0 mm Number of attempts: 1 Airway Equipment and Method: Stylet Placement Confirmation: ETT inserted through vocal cords under direct vision,  positive ETCO2,  CO2 detector and breath sounds checked- equal and bilateral Secured at: 21 cm Tube secured with: Tape Dental Injury: Teeth and Oropharynx as per pre-operative assessment

## 2019-04-11 NOTE — Progress Notes (Addendum)
Day of Surgery   Subjective/Chief Complaint: Seen in pre-op   Objective: Vital signs in last 24 hours: Temp:  [98 F (36.7 C)-98.6 F (37 C)] 98.4 F (36.9 C) (01/28 0754) Pulse Rate:  [58-67] 67 (01/28 0754) Resp:  [14-18] 18 (01/28 0754) BP: (146-164)/(53-76) 158/69 (01/28 0754) SpO2:  [88 %-98 %] 98 % (01/28 0754) Weight:  [72.5 kg] 72.5 kg (01/28 0810) Last BM Date: (PTA )  Intake/Output from previous day: 01/27 0701 - 01/28 0700 In: 500 [IV Piggyback:500] Out: -  Intake/Output this shift: Total I/O In: 808.5 [I.V.:808.5] Out: 25 [Blood:25]  General appearance: cooperative Resp: clear to auscultation bilaterally Cardio: regular rate and rhythm GI: soft, mild tenderness RUQ  Lab Results:  Recent Labs    04/10/19 0726 04/11/19 0213  WBC 9.8 7.7  HGB 13.3 10.8*  HCT 37.8 30.9*  PLT 282 202   BMET Recent Labs    04/10/19 0726 04/11/19 0213  NA 127* 138  K 3.0* 3.2*  CL 86* 102  CO2 26 25  GLUCOSE 111* 85  BUN 15 9  CREATININE 1.74* 1.06*  CALCIUM 9.7 8.6*   PT/INR No results for input(s): LABPROT, INR in the last 72 hours. ABG No results for input(s): PHART, HCO3 in the last 72 hours.  Invalid input(s): PCO2, PO2  Studies/Results: US Abdomen Limited RUQ  Result Date: 04/10/2019 CLINICAL DATA:  Upper abdominal pain EXAM: ULTRASOUND ABDOMEN LIMITED RIGHT UPPER QUADRANT COMPARISON:  None. FINDINGS: Gallbladder: Within the gallbladder, there is a 6 mm echogenic focus which neither moves nor shadows, a presumed polyp. No evident gallstones, gallbladder wall thickening, or pericholecystic fluid. No sonographic Murphy sign noted by sonographer. Common bile duct: Diameter: 5 mm. No intrahepatic or extrahepatic biliary duct dilatation. Liver: No focal lesion identified. Within normal limits in parenchymal echogenicity. Portal vein is patent on color Doppler imaging with normal direction of blood flow towards the liver. Other: None. IMPRESSION: 1. 6 mm  apparent gallbladder polyp. Per consensus guidelines, a polyp of this size does not warrant additional imaging surveillance. No gallstones, gallbladder wall thickening, or pericholecystic fluid. 2.  Study otherwise unremarkable. Electronically Signed   By: Bretta Bang III M.D.   On: 04/10/2019 08:47    Anti-infectives: Anti-infectives (From admission, onward)   Start     Dose/Rate Route Frequency Ordered Stop   04/10/19 1130  [MAR Hold]  cefTRIAXone (ROCEPHIN) 2 g in sodium chloride 0.9 % 100 mL IVPB     (MAR Hold since Thu 04/11/2019 at 0805.Hold Reason: Transfer to a Procedural area.)   2 g 200 mL/hr over 30 Minutes Intravenous Every 24 hours 04/10/19 1104        Assessment/Plan: Biliary dyskinesia.  For laparoscopic cholecystectomy and possible cholangiogram.  Procedure, risks, and benefits discussed again.  She is agreeable.  I discussed the expected postoperative course.  AKI - resolved  LOS: 1 day    Miranda Wells 04/11/2019

## 2019-04-11 NOTE — Progress Notes (Signed)
    CC: Abdominal pain and nausea  Subjective: Patient feels much better this a.m.  Awaiting surgery.  Objective: Vital signs in last 24 hours: Temp:  [98 F (36.7 C)-98.6 F (37 C)] 98.4 F (36.9 C) (01/28 0403) Pulse Rate:  [54-66] 66 (01/28 0403) Resp:  [13-21] 16 (01/28 0403) BP: (146-164)/(53-76) 159/64 (01/28 0403) SpO2:  [88 %-100 %] 98 % (01/28 0403) Last BM Date: (PTA )  Intake/Output from previous day: 01/27 0701 - 01/28 0700 In: 500 [IV Piggyback:500] Out: -  Intake/Output this shift: No intake/output data recorded.  General appearance: alert, cooperative and no distress Resp: clear to auscultation bilaterally Cardio: Regular rate and rhythm GI: Less tender right upper quadrant. Extremities: extremities normal, atraumatic, no cyanosis or edema  Lab Results:  Recent Labs    04/10/19 0726 04/11/19 0213  WBC 9.8 7.7  HGB 13.3 10.8*  HCT 37.8 30.9*  PLT 282 202    BMET Recent Labs    04/10/19 0726 04/11/19 0213  NA 127* 138  K 3.0* 3.2*  CL 86* 102  CO2 26 25  GLUCOSE 111* 85  BUN 15 9  CREATININE 1.74* 1.06*  CALCIUM 9.7 8.6*   PT/INR No results for input(s): LABPROT, INR in the last 72 hours.  Recent Labs  Lab 04/05/19 0244 04/10/19 0726 04/11/19 0213  AST 18 26 19   ALT 14 17 14   ALKPHOS 68 66 49  BILITOT 0.4 1.0 0.5  PROT 7.1 7.1 5.4*  ALBUMIN 4.1 4.2 3.1*     Lipase     Component Value Date/Time   LIPASE 21 04/10/2019 0726     Medications: . famotidine  20 mg Oral Daily  . metoprolol succinate  25 mg Oral Daily  . polyethylene glycol  17 g Oral Daily    Assessment/Plan Hypertension GERD Hyperlipidemia Constipation  Recurrent abdominal pain Biliary dyskinesia -HIDA 04/05/2019 AKI/dehydration - improved Hyponatremia/hypokalemia - Improved adding IV Kcl runs x 3 this AM  FEN: Clear liquids/IV fluids ID: Rocephin DVT: Lovenox per pharmacy Follow-up: Clinic  Plan: Surgery later today.        LOS: 1 day     Miranda Wells 04/11/2019 Please see Amion

## 2019-04-11 NOTE — Anesthesia Postprocedure Evaluation (Signed)
Anesthesia Post Note  Patient: Miranda Wells  Procedure(s) Performed: LAPAROSCOPIC CHOLECYSTECTOMY (N/A )     Patient location during evaluation: PACU Anesthesia Type: General Level of consciousness: sedated and patient cooperative Pain management: pain level controlled Vital Signs Assessment: post-procedure vital signs reviewed and stable Respiratory status: spontaneous breathing Cardiovascular status: stable Anesthetic complications: no    Last Vitals:  Vitals:   04/11/19 1058 04/11/19 1425  BP: (!) 164/71 (!) 155/76  Pulse: 66 83  Resp: 20 20  Temp: 36.6 C 36.5 C  SpO2: 93% 97%    Last Pain:  Vitals:   04/11/19 1432  TempSrc:   PainSc: 3                  Lewie Loron

## 2019-04-11 NOTE — Discharge Summary (Signed)
Physician Discharge Summary  Patient ID: Miranda Wells MRN: [id number] DOB/AGE: 10/27/1955 64 y.o.  Admit date: 04/10/2019 Discharge date: 04/11/2019    Admission Diagnoses:  Biliary dyskinesia AKI Dehydration Hyponatremia Hypokalemia Hypertension GERD Hyperlipidemia Constipation  Discharge Diagnoses:  Biliary dyskinesia with chronic cholecystitis AKI Dehydration Hyponatremia Hypokalemia Hypertension GERD Hyperlipidemia Constipation   Active Problems:   Biliary dyskinesia   PROCEDURES: Laparoscopic cholecystectomy, 04/11/2019 Dr. Mallie Darting Course:   Patient is a 64 year old female was seen 5 days ago and diagnosed with biliary dyskinesia based on a HIDA scan that showed an ejection fraction of 26%.  Plans were made for her to follow-up as an outpatient she actually has an appointment today with Dr. Windle Guard.  She says she has not been able to take anything orally except some liquids and has continued abdominal pain and constipation.  She has not had a bowel movement since last Tuesday.  She was seen by her primary care yesterday x-ray did show some colon throughout the stool.  Pain became worse last evening and she presents to the ED again today.  Work-up in the ED shows she is afebrile vital signs are stable.  Sodium is 127, potassium is 3, creatinine up to 1.74, LFTs are normal.  Lipase is 21. WBC 9.8, hemoglobin 13.3, hematocrit 37, platelets 282,000 Covid panel is pending.  Right upper quadrant ultrasound shows what appears to be a 6 mm polyp no evident gallstones, gallbladder wall thickening, or pericholecystic fluid no Murphy sign.  CBD is 5 mm.  We are asked to see.  Patient was seen in the emergency department and had uncontrolled pain, hyponatremia hypokalemia dehydration.  Creatinine has doubled as noted above.  She was admitted and placed on IV fluids for rehydration.  She felt much better after hydration.  She was taken the operating room and  underwent laparoscopic cholecystectomy.  She tolerated the procedure well and done well postop.  Her labs showed marked improvement after 24 hours of hydration.  She underwent laparoscopic cholecystectomy on 04/11/2019.  She is done well she is and eating lunch.  We will mobilize her some more if she can eat, drink, walk, her pain is well controlled with oral medications and void; she can go home later this afternoon.  We vascular to see her PCP next week to recheck her labs to make sure her renal function LFTs are normal and there is no issue.  Condition on discharge: Improved.  CBC Latest Ref Rng & Units 04/11/2019 04/10/2019 04/05/2019  WBC 4.0 - 10.5 K/uL 7.7 9.8 11.6(H)  Hemoglobin 12.0 - 15.0 g/dL 10.8(L) 13.3 13.0  Hematocrit 36.0 - 46.0 % 30.9(L) 37.8 36.7  Platelets 150 - 400 K/uL 202 282 315   CMP Latest Ref Rng & Units 04/11/2019 04/10/2019 04/05/2019  Glucose 70 - 99 mg/dL 85 111(H) 128(H)  BUN 8 - 23 mg/dL 9 15 8   Creatinine 0.44 - 1.00 mg/dL 1.06(H) 1.74(H) 0.67  Sodium 135 - 145 mmol/L 138 127(L) 126(L)  Potassium 3.5 - 5.1 mmol/L 3.2(L) 3.0(L) 2.8(L)  Chloride 98 - 111 mmol/L 102 86(L) 88(L)  CO2 22 - 32 mmol/L 25 26 24   Calcium 8.9 - 10.3 mg/dL 8.6(L) 9.7 9.4  Total Protein 6.5 - 8.1 g/dL 5.4(L) 7.1 7.1  Total Bilirubin 0.3 - 1.2 mg/dL 0.5 1.0 0.4  Alkaline Phos 38 - 126 U/L 49 66 68  AST 15 - 41 U/L 19 26 18   ALT 0 - 44 U/L 14 17 14  Disposition: Home   Allergies as of 04/11/2019      Reactions   Ace Inhibitors Cough      Medication List    TAKE these medications   acetaminophen 325 MG tablet Commonly known as: TYLENOL You can take 1000 mg every 8 hours as needed.  For the next 24-48 take it on schedule.  As your pain improves you can go back to every 8 hours as needed.  You can buy this over-the-counter at any drugstore.  Do not take more than 4000 mg of Tylenol per day it can harm your liver.   amLODipine 10 MG tablet Commonly known as: NORVASC Take 10 mg by  mouth daily.   atorvastatin 40 MG tablet Commonly known as: LIPITOR Take 40 mg by mouth daily.   cloNIDine 0.1 MG tablet Commonly known as: CATAPRES Take 0.2 mg by mouth every evening.   famciclovir 125 MG tablet Commonly known as: FAMVIR Take 125 mg by mouth 2 (two) times daily. For 5 days for outbreak   hydrochlorothiazide 25 MG tablet Commonly known as: HYDRODIURIL Take 25 mg by mouth daily.   losartan 100 MG tablet Commonly known as: COZAAR Take 150 mg by mouth daily.   metoprolol succinate 25 MG 24 hr tablet Commonly known as: TOPROL-XL Take 50 mg by mouth daily.   omeprazole 40 MG capsule Commonly known as: PRILOSEC Take 1 capsule (40 mg total) by mouth daily.   ondansetron 4 MG tablet Commonly known as: ZOFRAN Take 1 tablet (4 mg total) by mouth every 6 (six) hours as needed for nausea or vomiting.   oxyCODONE 5 MG immediate release tablet Commonly known as: Oxy IR/ROXICODONE Take 1 tablet (5 mg total) by mouth every 4 (four) hours as needed for severe pain or breakthrough pain (Pain not relieved by Tylenol). What changed:   when to take this  reasons to take this   potassium chloride 10 MEQ tablet Commonly known as: KLOR-CON Take 10 mEq by mouth daily.   raloxifene 60 MG tablet Commonly known as: EVISTA Take 60 mg by mouth daily.   VITAMIN C PO Take 1 tablet by mouth daily.   VITAMIN D3 PO Take 1 tablet by mouth daily.      Follow-up Information    [facility] Follow up on 04/25/2019.   Specialty: General Surgery Why: Follow up appointment scheduled for 2 PM. A provider will call you during scheduled appointment time. Please send a photo of incisions,license, and insurance card to [email or web address] the day prior to appointment.    Contact information: [address] [place] [address] [phone]        Marden Noble, MD Follow up.   Specialty: Internal Medicine Why: Call and let him follow-up  for medical issues, let him know he had surgery and recheck your labs next week.  (CMP,CBC)  He may need to adjust BP medicines. Contact information: [address] [place] [address] [phone]           Signed: Sherrie George 04/11/2019, 11:14 AM

## 2019-04-11 NOTE — Anesthesia Preprocedure Evaluation (Signed)
Anesthesia Evaluation  Patient identified by MRN, date of birth, ID band Patient awake    Reviewed: Allergy & Precautions, NPO status , Patient's Chart, lab work & pertinent test results  Airway Mallampati: II  TM Distance: >3 FB Neck ROM: Full    Dental  (+) Dental Advisory Given   Pulmonary former smoker,    Pulmonary exam normal breath sounds clear to auscultation       Cardiovascular hypertension, Normal cardiovascular exam Rhythm:Regular Rate:Normal     Neuro/Psych negative neurological ROS  negative psych ROS   GI/Hepatic Neg liver ROS, GERD  ,  Endo/Other  negative endocrine ROS  Renal/GU negative Renal ROS     Musculoskeletal negative musculoskeletal ROS (+)   Abdominal   Peds  Hematology negative hematology ROS (+)   Anesthesia Other Findings   Reproductive/Obstetrics negative OB ROS                             Anesthesia Physical Anesthesia Plan  ASA: II  Anesthesia Plan: General   Post-op Pain Management:    Induction: Intravenous  PONV Risk Score and Plan: 4 or greater and Ondansetron, Dexamethasone, Treatment may vary due to age or medical condition and Midazolam  Airway Management Planned: Oral ETT  Additional Equipment: None  Intra-op Plan:   Post-operative Plan: Extubation in OR  Informed Consent: I have reviewed the patients History and Physical, chart, labs and discussed the procedure including the risks, benefits and alternatives for the proposed anesthesia with the patient or authorized representative who has indicated his/her understanding and acceptance.     Dental advisory given  Plan Discussed with: CRNA  Anesthesia Plan Comments:         Anesthesia Quick Evaluation

## 2019-04-11 NOTE — Discharge Instructions (Signed)
[facility] ______[facility] °LAPAROSCOPIC SURGERY: POST OP INSTRUCTIONS °Always review your discharge instruction sheet given to you by the facility where your surgery was performed. °IF YOU HAVE DISABILITY OR FAMILY LEAVE FORMS, YOU MUST BRING THEM TO THE OFFICE FOR PROCESSING.   °DO NOT GIVE THEM TO YOUR DOCTOR. ° °1. A prescription for pain medication may be given to you upon discharge.  Take your pain medication as prescribed, if needed.  If narcotic pain medicine is not needed, then you may take acetaminophen (Tylenol) or ibuprofen (Advil) as needed. °2. Take your usually prescribed medications unless otherwise directed. °3. If you need a refill on your pain medication, please contact your pharmacy.  They will contact our office to request authorization. Prescriptions will not be filled after 5pm or on week-ends. °4. You should follow a light diet the first few days after arrival home, such as soup and crackers, etc.  Be sure to include lots of fluids daily. °5. Most patients will experience some swelling and bruising in the area of the incisions.  Ice packs will help.  Swelling and bruising can take several days to resolve.  °6. It is common to experience some constipation if taking pain medication after surgery.  Increasing fluid intake and taking a stool softener (such as Colace) will usually help or prevent this problem from occurring.  A mild laxative (Milk of Magnesia or Miralax) should be taken according to package instructions if there are no bowel movements after 48 hours. °7. Unless discharge instructions indicate otherwise, you may remove your bandages 24-48 hours after surgery, and you may shower at that time.  You may have steri-strips (small skin tapes) in place directly over the incision.  These strips should be left on the skin for 7-10 days.  If your surgeon used skin glue on the incision, you may shower in 24 hours.  The glue will flake off over the next 2-3 weeks.  Any sutures or  staples will be removed at the office during your follow-up visit. °8. ACTIVITIES:  You may resume regular (light) daily activities beginning the next day--such as daily self-care, walking, climbing stairs--gradually increasing activities as tolerated.  You may have sexual intercourse when it is comfortable.  Refrain from any heavy lifting or straining until approved by your doctor. °a. You may drive when you are no longer taking prescription pain medication, you can comfortably wear a seatbelt, and you can safely maneuver your car and apply brakes. °b. RETURN TO WORK:  __________________________________________________________ °9. You should see your doctor in the office for a follow-up appointment approximately 2-3 weeks after your surgery.  Make sure that you call for this appointment within a day or two after you arrive home to insure a convenient appointment time. °10. OTHER INSTRUCTIONS: __________________________________________________________________________________________________________________________ __________________________________________________________________________________________________________________________ °WHEN TO CALL YOUR DOCTOR: °1. Fever over 101.0 °2. Inability to urinate °3. Continued bleeding from incision. °4. Increased pain, redness, or drainage from the incision. °5. Increasing abdominal pain ° °The clinic staff is available to answer your questions during regular business hours.  Please don’t hesitate to call and ask to speak to one of the nurses for clinical concerns.  If you have a medical emergency, go to the nearest emergency room or call 911.  A surgeon from [facility] is always on call at the hospital. °[address], [address], [place], [place]  [address] ? [address] [address], [place],    [address] °[phone] ? [phone] ? FAX [phone] °Web site:   [email or web address] °

## 2019-04-11 NOTE — Op Note (Signed)
  04/11/2019  10:05 AM  PATIENT:  Miranda Wells  64 y.o. female  PRE-OPERATIVE DIAGNOSIS:  BILIARY DYSKENISIA  POST-OPERATIVE DIAGNOSIS:  BILIARY DYSKENISIA, CHRONIC CHOLECYSTITIS  PROCEDURE:  Procedure(s): LAPAROSCOPIC CHOLECYSTECTOMY  SURGEON:  Surgeon(s): Violeta Gelinas, MD  ASSISTANTS: none   ANESTHESIA:   local and general  EBL:  Total I/O In: 808.5 [I.V.:808.5] Out: 25 [Blood:25]  BLOOD ADMINISTERED:none  DRAINS: none   SPECIMEN:  Excision  DISPOSITION OF SPECIMEN:  PATHOLOGY  COUNTS:  YES  DICTATION: .Dragon Dictation Findings: Evidence of chronic inflammation  Procedure in detail: Informed consent was obtained.  She is on IV antibiotics.  She was brought to the operating room and general endotracheal anesthesia was administered by the anesthesia staff.  Her abdomen was prepped and draped in sterile fashion.  We did a timeout procedure.The infraumbilical region was infiltrated with local. Infraumbilical incision was made. Subcutaneous tissues were dissected down revealing the anterior fascia. This was divided sharply along the midline. Peritoneal cavity was entered under direct vision without complication. A 0 Vicryl pursestring was placed around the fascial opening. Hassan trocar was inserted into the abdomen. The abdomen was insufflated with carbon dioxide in standard fashion. Under direct vision a 5 mm epigastric and 5 mm right abdominal port x2 were placed.  Local was used at each port site.  Laparoscopic exploration revealed evidence of chronic inflammation with adhesions from the duodenum to the gallbladder.  These were taken down carefully.  The dome of the gallbladder was retracted superior medially.  The infundibulum was retracted inferior laterally.  Dissection began laterally and progressed medially.  I continued dissection until a critical view of safety was achieved around the cystic duct.  I also identified the cystic artery.  The cystic artery was clipped  twice proximally once distally and divided.  The cystic duct was then clipped 3 times proximally, once distally and divided.  The gallbladder was taken off the liver bed using Bovie cautery and getting excellent hemostasis.  It was placed in a bag and removed from the abdomen.  It was sent to pathology.  Liver bed was irrigated.  Clips were in good position.  Hemostasis was ensured.  Irrigation for was evacuated.  Ports removed under direct vision.  Pneumoperitoneum was released.  Infraumbilical fascia was closed by tying the pursestring.  All 4 wounds were copiously irrigated the skin of each was closed with Vicryl and Dermabond.  All counts were correct.  She tolerated procedure well without apparent complication was taken recovery in stable condition.  PATIENT DISPOSITION:  PACU - hemodynamically stable.   Delay start of Pharmacological VTE agent (>24hrs) due to surgical blood loss or risk of bleeding:  no  Violeta Gelinas, MD, MPH, FACS Pager: [phone]  1/28/202110:05 AM

## 2019-04-11 NOTE — Transfer of Care (Signed)
Immediate Anesthesia Transfer of Care Note  Patient: Miranda Wells  Procedure(s) Performed: LAPAROSCOPIC CHOLECYSTECTOMY (N/A )  Patient Location: PACU  Anesthesia Type:General  Level of Consciousness: awake, alert  and oriented  Airway & Oxygen Therapy: Patient Spontanous Breathing and Patient connected to nasal cannula oxygen  Post-op Assessment: Report given to RN and Post -op Vital signs reviewed and stable  Post vital signs: Reviewed and stable  Last Vitals:  Vitals Value Taken Time  BP    Temp    Pulse 92 04/11/19 1007  Resp 11 04/11/19 1007  SpO2 96 % 04/11/19 1007  Vitals shown include unvalidated device data.  Last Pain:  Vitals:   04/11/19 0754  TempSrc: Oral  PainSc:          Complications: No apparent anesthesia complications

## 2019-04-11 NOTE — Discharge Planning (Signed)
Patient discharged home in stable condition. Verbalizes understanding of all discharge instructions, including home medications and follow up appointments. 

## 2019-04-12 LAB — SURGICAL PATHOLOGY

## 2019-04-16 DIAGNOSIS — R109 Unspecified abdominal pain: Secondary | ICD-10-CM | POA: Diagnosis not present

## 2019-04-16 DIAGNOSIS — K5900 Constipation, unspecified: Secondary | ICD-10-CM | POA: Diagnosis not present

## 2019-04-16 DIAGNOSIS — E876 Hypokalemia: Secondary | ICD-10-CM | POA: Diagnosis not present

## 2019-04-16 DIAGNOSIS — I10 Essential (primary) hypertension: Secondary | ICD-10-CM | POA: Diagnosis not present

## 2019-05-15 DIAGNOSIS — I10 Essential (primary) hypertension: Secondary | ICD-10-CM | POA: Diagnosis not present

## 2019-05-15 DIAGNOSIS — M8580 Other specified disorders of bone density and structure, unspecified site: Secondary | ICD-10-CM | POA: Diagnosis not present

## 2019-05-15 DIAGNOSIS — E876 Hypokalemia: Secondary | ICD-10-CM | POA: Diagnosis not present

## 2020-10-11 ENCOUNTER — Emergency Department (HOSPITAL_BASED_OUTPATIENT_CLINIC_OR_DEPARTMENT_OTHER)
Admission: EM | Admit: 2020-10-11 | Discharge: 2020-10-11 | Disposition: A | Discharge status: Home / Self Care | Payer: Medicare Other | Attending: Emergency Medicine | Admitting: Emergency Medicine

## 2020-10-11 ENCOUNTER — Emergency Department (HOSPITAL_BASED_OUTPATIENT_CLINIC_OR_DEPARTMENT_OTHER): Payer: Medicare Other

## 2020-10-11 ENCOUNTER — Encounter (HOSPITAL_BASED_OUTPATIENT_CLINIC_OR_DEPARTMENT_OTHER): Payer: Self-pay | Admitting: Emergency Medicine

## 2020-10-11 DIAGNOSIS — Z79899 Other long term (current) drug therapy: Secondary | ICD-10-CM | POA: Diagnosis not present

## 2020-10-11 DIAGNOSIS — Z87891 Personal history of nicotine dependence: Secondary | ICD-10-CM | POA: Diagnosis not present

## 2020-10-11 DIAGNOSIS — I10 Essential (primary) hypertension: Secondary | ICD-10-CM | POA: Insufficient documentation

## 2020-10-11 DIAGNOSIS — M545 Low back pain, unspecified: Secondary | ICD-10-CM | POA: Insufficient documentation

## 2020-10-11 DIAGNOSIS — R030 Elevated blood-pressure reading, without diagnosis of hypertension: Secondary | ICD-10-CM

## 2020-10-11 DIAGNOSIS — M5136 Other intervertebral disc degeneration, lumbar region: Secondary | ICD-10-CM

## 2020-10-11 MED ORDER — METHOCARBAMOL 500 MG PO TABS
750.0000 mg | ORAL_TABLET | Freq: Once | ORAL | Status: AC
  Administered 2020-10-11: 750 mg via ORAL
  Filled 2020-10-11: qty 2

## 2020-10-11 MED ORDER — OXYCODONE-ACETAMINOPHEN 5-325 MG PO TABS
1.0000 | ORAL_TABLET | Freq: Once | ORAL | Status: AC
  Administered 2020-10-11: 1 via ORAL
  Filled 2020-10-11: qty 1

## 2020-10-11 MED ORDER — METHOCARBAMOL 750 MG PO TABS: 750.0000 mg | ORAL_TABLET | Freq: Three times a day (TID) | ORAL | 0 refills | Status: AC | PRN

## 2020-10-11 MED ORDER — ACETAMINOPHEN 500 MG PO TABS
1000.0000 mg | ORAL_TABLET | Freq: Once | ORAL | Status: AC
  Administered 2020-10-11: 1000 mg via ORAL
  Filled 2020-10-11: qty 2

## 2020-10-11 NOTE — ED Triage Notes (Signed)
Pt reports 2 weeks of lower back pain, primary MD prescribed prednisone taper and just completed 10 days yesterday. Last night pain returned and is worse, shooting up the right side,unrelieved by tylenol or ice. No recent falls. No other symptoms

## 2020-10-11 NOTE — Discharge Instructions (Addendum)
It was our pleasure to provide your ER care today - we hope that you feel better.  Avoid bending at waist, or heavy lifting > 10 lbs for the next week. You may also try heat therapy and gentle massage of area.   Take ibuprofen or aleve, and/or acetaminophen  as need for pain. You may also take robaxin as need for muscle pain/spasm - no driving when taking.   Follow up with primary care doctor in 1-2 weeks if symptoms fail to improve/resolve.  Also follow up with primary care doctor for recheck of blood pressure as it is high today.  Return to ER if worse, new symptoms, numbness/weakness, problems w bowel/bladder function, intractable pain, fevers, or other concern.   You were given pain meds in the ER - no driving for the next 6 hours.

## 2020-10-11 NOTE — ED Notes (Signed)
Pt dc home with belongings ,pt voices understanding of dc instructions and prescriptions.

## 2020-10-11 NOTE — ED Provider Notes (Signed)
[facility] EMERGENCY DEPT Provider Note   CSN: [id number] Arrival date & time: 10/11/20  6834     History Chief Complaint  Patient presents with   Back Pain    Miranda Wells is a 65 y.o. female.  Patient c/o low back pain, esp right, for past couple weeks. Symptoms acute onset, moderate, constant, worse w certain movements or position changes, non radiating. Denies specific injury or strain. Pt indicates low back pain intermittently for past couple years, states will usually get better with rest, massage, etc. No prior dx ddd. No radicular pain. No saddle area or leg numbness. No weakness or loss of normal function. No problems with normal bowel or bladder control and function. No anterior pain, no abd or pelvic pain. No fever or chills. States is just now completing course of prednisone - states prednisone did help her pain.   The history is provided by the patient.  Back Pain Associated symptoms: no abdominal pain, no chest pain, no dysuria, no fever, no numbness, no pelvic pain and no weakness       Past Medical History:  Diagnosis Date   GERD (gastroesophageal reflux disease)    Hypercholesteremia    Hypertension     Patient Active Problem List   Diagnosis Date Noted   Biliary dyskinesia 04/10/2019    Past Surgical History:  Procedure Laterality Date   CHOLECYSTECTOMY N/A 04/11/2019   Procedure: LAPAROSCOPIC CHOLECYSTECTOMY;  Surgeon: Violeta Gelinas, MD;  Location: [facility] OR;  Service: General;  Laterality: N/A;   TUBAL LIGATION       OB History   No obstetric history on file.     No family history on file.  Social History   Tobacco Use   Smoking status: Former   Smokeless tobacco: Former  Substance Use Topics   Alcohol use: Never   Drug use: Never    Home Medications Prior to Admission medications   Medication Sig Start Date End Date Taking? Authorizing Provider  amLODipine (NORVASC) 10 MG tablet Take 10 mg by mouth daily.  03/27/18  Yes  [provider]  Ascorbic Acid (VITAMIN C PO) Take 1 tablet by mouth daily.    Yes [provider]  atorvastatin (LIPITOR) 40 MG tablet Take 40 mg by mouth daily.  04/24/18  Yes [provider]  Cholecalciferol (VITAMIN D3 PO) Take 1 tablet by mouth daily.    Yes [provider]  cloNIDine (CATAPRES) 0.1 MG tablet Take 0.2 mg by mouth every evening.  03/27/18  Yes [provider]  hydrochlorothiazide (HYDRODIURIL) 25 MG tablet Take 25 mg by mouth daily.  03/27/18  Yes [provider]  losartan (COZAAR) 100 MG tablet Take 150 mg by mouth daily.  04/26/18  Yes [provider]  metoprolol succinate (TOPROL-XL) 25 MG 24 hr tablet Take 50 mg by mouth daily.  03/04/19  Yes [provider]  omeprazole (PRILOSEC) 40 MG capsule Take 1 capsule (40 mg total) by mouth daily. 04/05/19  Yes Maczis, Elmer Sow, PA-C  potassium chloride (KLOR-CON) 10 MEQ tablet Take 10 mEq by mouth daily. 03/26/19  Yes [provider]  raloxifene (EVISTA) 60 MG tablet Take 60 mg by mouth daily.  04/24/18  Yes [provider]  acetaminophen (TYLENOL) 325 MG tablet You can take 1000 mg every 8 hours as needed.  For the next 24-48 take it on schedule.  As your pain improves you can go back to every 8 hours as needed.  You can buy  this over-the-counter at any drugstore.  Do not take more than 4000 mg of Tylenol per day it can harm your liver. 04/11/19   Sherrie George, PA-C  famciclovir (FAMVIR) 125 MG tablet Take 125 mg by mouth 2 (two) times daily. For 5 days for outbreak 12/22/17   [provider]  ondansetron (ZOFRAN) 4 MG tablet Take 1 tablet (4 mg total) by mouth every 6 (six) hours as needed for nausea or vomiting. 04/05/19   Maczis, Elmer Sow, PA-C  oxyCODONE (OXY IR/ROXICODONE) 5 MG immediate release tablet Take 1 tablet (5 mg total) by mouth every 4 (four) hours as needed for severe pain or breakthrough pain (Pain not relieved by  Tylenol). 04/11/19   Sherrie George, PA-C    Allergies    Ace inhibitors  Review of Systems   Review of Systems  Constitutional:  Negative for chills and fever.  HENT:  Negative for sore throat.   Eyes:  Negative for redness.  Respiratory:  Negative for shortness of breath.   Cardiovascular:  Negative for chest pain.  Gastrointestinal:  Negative for abdominal pain.  Genitourinary:  Negative for difficulty urinating, dysuria, hematuria and pelvic pain.  Musculoskeletal:  Positive for back pain. Negative for neck pain.  Skin:  Negative for rash.  Neurological:  Negative for weakness and numbness.  Hematological:  Does not bruise/bleed easily.  Psychiatric/Behavioral:  Negative for confusion.    Physical Exam Updated Vital Signs BP (!) 180/74   Temp 98 F (36.7 C) (Oral)   Resp 20   SpO2 100%   Physical Exam Vitals and nursing note reviewed.  Constitutional:      Appearance: Normal appearance. She is well-developed.  HENT:     Head: Atraumatic.     Nose: Nose normal.     Mouth/Throat:     Mouth: Mucous membranes are moist.  Eyes:     General: No scleral icterus.    Conjunctiva/sclera: Conjunctivae normal.  Neck:     Trachea: No tracheal deviation.  Cardiovascular:     Rate and Rhythm: Normal rate.     Pulses: Normal pulses.  Pulmonary:     Effort: Pulmonary effort is normal. No respiratory distress.  Abdominal:     General: There is no distension.     Palpations: There is no mass.     Tenderness: There is no abdominal tenderness.  Genitourinary:    Comments: No cva tenderness.  Musculoskeletal:        General: No swelling.     Cervical back: Neck supple. No muscular tenderness.     Comments: Lower lumbar tenderness and right lumbar paraspinal muscle tenderness, otherwise, T/L/S spine non tender, aligned. No soft tissue swelling. No skin lesions or erythema.   Skin:    General: Skin is warm and dry.     Findings: No rash.  Neurological:     Mental Status:  She is alert.     Comments: Alert, speech normal. Straight leg raise neg. Motor/sens grossly intact. Steady gait.   Psychiatric:        Mood and Affect: Mood normal.    ED Results / Procedures / Treatments   Labs (all labs ordered are listed, but only abnormal results are displayed) Labs Reviewed - No data to display  EKG None  Radiology No results found.  Procedures Procedures   Medications Ordered in ED Medications  acetaminophen (TYLENOL) tablet 1,000 mg (has no administration in time range)  methocarbamol (ROBAXIN) tablet 750 mg (has no administration in time  range)    ED Course  I have reviewed the triage vital signs and the nursing notes.  Pertinent labs & imaging results that were available during my care of the patient were reviewed by me and considered in my medical decision making (see chart for details).    MDM Rules/Calculators/A&P                          Pt indicates has ride, does not have to drive. No meds this AM for pain.  Acetaminophen po. Robaxin po.   Reviewed nursing notes and prior charts for additional history.   Xrays reviewed/interpreted by me - no fx. Degen changes noted - discussed w pt.   Rx for home. Rec pcp f/u.  Return precautions provided.   Final Clinical Impression(s) / ED Diagnoses Final diagnoses:  None    Rx / DC Orders ED Discharge Orders     None        Cathren Laine, MD 10/14/20 [phone]

## 2020-10-12 ENCOUNTER — Emergency Department (HOSPITAL_BASED_OUTPATIENT_CLINIC_OR_DEPARTMENT_OTHER): Payer: Medicare Other

## 2020-10-12 ENCOUNTER — Emergency Department (HOSPITAL_BASED_OUTPATIENT_CLINIC_OR_DEPARTMENT_OTHER)
Admission: EM | Admit: 2020-10-12 | Discharge: 2020-10-12 | Disposition: A | Discharge status: Home / Self Care | Payer: Medicare Other | Attending: Emergency Medicine | Admitting: Emergency Medicine

## 2020-10-12 DIAGNOSIS — Z79899 Other long term (current) drug therapy: Secondary | ICD-10-CM | POA: Diagnosis not present

## 2020-10-12 DIAGNOSIS — Z87891 Personal history of nicotine dependence: Secondary | ICD-10-CM | POA: Insufficient documentation

## 2020-10-12 DIAGNOSIS — R110 Nausea: Secondary | ICD-10-CM | POA: Insufficient documentation

## 2020-10-12 DIAGNOSIS — I10 Essential (primary) hypertension: Secondary | ICD-10-CM | POA: Diagnosis not present

## 2020-10-12 DIAGNOSIS — M545 Low back pain, unspecified: Secondary | ICD-10-CM | POA: Diagnosis not present

## 2020-10-12 DIAGNOSIS — E860 Dehydration: Secondary | ICD-10-CM | POA: Insufficient documentation

## 2020-10-12 DIAGNOSIS — K5732 Diverticulitis of large intestine without perforation or abscess without bleeding: Secondary | ICD-10-CM | POA: Diagnosis not present

## 2020-10-12 DIAGNOSIS — G8929 Other chronic pain: Secondary | ICD-10-CM

## 2020-10-12 LAB — CBC WITH DIFFERENTIAL/PLATELET
Abs Immature Granulocytes: 0.06 10*3/uL (ref 0.00–0.07)
Basophils Absolute: 0 10*3/uL (ref 0.0–0.1)
Basophils Relative: 0 %
Eosinophils Absolute: 0 10*3/uL (ref 0.0–0.5)
Eosinophils Relative: 0 %
HCT: 37.7 % (ref 36.0–46.0)
Hemoglobin: 13.3 g/dL (ref 12.0–15.0)
Immature Granulocytes: 0 %
Lymphocytes Relative: 9 %
Lymphs Abs: 1.4 10*3/uL (ref 0.7–4.0)
MCH: 29.5 pg (ref 26.0–34.0)
MCHC: 35.3 g/dL (ref 30.0–36.0)
MCV: 83.6 fL (ref 80.0–100.0)
Monocytes Absolute: 0.7 10*3/uL (ref 0.1–1.0)
Monocytes Relative: 4 %
Neutro Abs: 13.5 10*3/uL — ABNORMAL HIGH (ref 1.7–7.7)
Neutrophils Relative %: 87 %
Platelets: 275 10*3/uL (ref 150–400)
RBC: 4.51 MIL/uL (ref 3.87–5.11)
RDW: 12.3 % (ref 11.5–15.5)
WBC: 15.7 10*3/uL — ABNORMAL HIGH (ref 4.0–10.5)
nRBC: 0 % (ref 0.0–0.2)

## 2020-10-12 LAB — BASIC METABOLIC PANEL
Anion gap: 13 (ref 5–15)
BUN: 16 mg/dL (ref 8–23)
CO2: 25 mmol/L (ref 22–32)
Calcium: 9.7 mg/dL (ref 8.9–10.3)
Chloride: 94 mmol/L — ABNORMAL LOW (ref 98–111)
Creatinine, Ser: 0.69 mg/dL (ref 0.44–1.00)
GFR, Estimated: >60 mL/min (ref >60)
Glucose, Bld: 123 mg/dL — ABNORMAL HIGH (ref 70–99)
Potassium: 3.3 mmol/L — ABNORMAL LOW (ref 3.5–5.1)
Sodium: 132 mmol/L — ABNORMAL LOW (ref 135–145)

## 2020-10-12 LAB — URINALYSIS, ROUTINE W REFLEX MICROSCOPIC
Bilirubin Urine: NEGATIVE
Glucose, UA: NEGATIVE mg/dL
Hgb urine dipstick: NEGATIVE
Ketones, ur: 15 mg/dL — AB
Leukocytes,Ua: NEGATIVE
Nitrite: NEGATIVE
Protein, ur: NEGATIVE mg/dL
Specific Gravity, Urine: 1.019 (ref 1.005–1.030)
pH: 6.5 (ref 5.0–8.0)

## 2020-10-12 MED ORDER — ONDANSETRON HCL 4 MG/2ML IJ SOLN
4.0000 mg | Freq: Once | INTRAMUSCULAR | Status: AC
  Administered 2020-10-12: 4 mg via INTRAVENOUS
  Filled 2020-10-12: qty 2

## 2020-10-12 MED ORDER — ONDANSETRON 4 MG PO TBDP: 4.0000 mg | ORAL_TABLET | Freq: Three times a day (TID) | ORAL | 0 refills | Status: DC | PRN

## 2020-10-12 MED ORDER — SODIUM CHLORIDE 0.9 % IV BOLUS
500.0000 mL | Freq: Once | INTRAVENOUS | Status: AC
  Administered 2020-10-12: 500 mL via INTRAVENOUS

## 2020-10-12 NOTE — Discharge Instructions (Addendum)
You are seen today for nausea and ongoing back pain.  Your further work-up is largely reassuring.  Take medications as prescribed and follow-up with your primary physician.

## 2020-10-12 NOTE — ED Triage Notes (Signed)
PT to ED from home seen earlier today at this facility with c/o lower back pain and N/V. Pt was prescribed muscle relaxer and states she was unable to take it due to her nausea.

## 2020-10-12 NOTE — ED Provider Notes (Signed)
[facility] Provider Note   CSN: [id number] Arrival date & time: 10/12/20  [id number]     History Chief Complaint  Patient presents with   Back Pain   Nausea    Miranda Coveamela J Wells is a 65 y.o. female.  HPI     This is a 65 year old female with a history of reflux and hypertension who presents with ongoing back pain and nausea.  Patient was seen and evaluated yesterday.  She states she has had progressively worsening right lower back pain.  She reports that sometimes her symptoms are worse with certain movements.  No radiation of the pain.  No bowel or bladder difficulty.  No lower extremity weakness.  She had plain films that were negative for acute process.  She was discharged with Percocet.  She states she took a Microbiologistercocet before she left.  Since she has been at home she has been extremely nauseous.  She has been unable to take any further pain medication.  She states her pain is currently 10 out of 10.  Denies hematuria or history of kidney stones.  Chart reviewed.  Negative lumbar imaging yesterday.  Past Medical History:  Diagnosis Date   GERD (gastroesophageal reflux disease)    Hypercholesteremia    Hypertension     Patient Active Problem List   Diagnosis Date Noted   Biliary dyskinesia 04/10/2019    Past Surgical History:  Procedure Laterality Date   CHOLECYSTECTOMY N/A 04/11/2019   Procedure: LAPAROSCOPIC CHOLECYSTECTOMY;  Surgeon: Violeta Gelinashompson, Burke, MD;  Location: [facility];  Service: General;  Laterality: N/A;   TUBAL LIGATION       OB History   No obstetric history on file.     No family history on file.  Social History   Tobacco Use   Smoking status: Former   Smokeless tobacco: Former  Substance Use Topics   Alcohol use: Never   Drug use: Never    Home Medications Prior to Admission medications   Medication Sig Start Date End Date Taking? Authorizing Provider  ondansetron (ZOFRAN ODT) 4 MG disintegrating tablet Take 1 tablet (4 mg  total) by mouth every 8 (eight) hours as needed for nausea or vomiting. 10/12/20  Yes Zora Glendenning, Mayer Maskerourtney F, MD  acetaminophen (TYLENOL) 325 MG tablet You can take 1000 mg every 8 hours as needed.  For the next 24-48 take it on schedule.  As your pain improves you can go back to every 8 hours as needed.  You can buy this over-the-counter at any drugstore.  Do not take more than 4000 mg of Tylenol per day it can harm your liver. 04/11/19   Sherrie GeorgeJennings, Willard, PA-C  amLODipine (NORVASC) 10 MG tablet Take 10 mg by mouth daily.  03/27/18   [provider]  Ascorbic Acid (VITAMIN C PO) Take 1 tablet by mouth daily.     [provider]  atorvastatin (LIPITOR) 40 MG tablet Take 40 mg by mouth daily.  04/24/18   [provider]  Cholecalciferol (VITAMIN D3 PO) Take 1 tablet by mouth daily.     [provider]  cloNIDine (CATAPRES) 0.1 MG tablet Take 0.2 mg by mouth every evening.  03/27/18   [provider]  famciclovir (FAMVIR) 125 MG tablet Take 125 mg by mouth 2 (two) times daily. For 5 days for outbreak 12/22/17   [provider]  hydrochlorothiazide (HYDRODIURIL) 25 MG tablet Take 25 mg by mouth daily.  03/27/18   [provider]  losartan (COZAAR) 100  MG tablet Take 150 mg by mouth daily.  04/26/18   [provider]  methocarbamol (ROBAXIN) 750 MG tablet Take 1 tablet (750 mg total) by mouth 3 (three) times daily as needed (muscle spasm/pain). 10/11/20   Cathren Laine, MD  metoprolol succinate (TOPROL-XL) 25 MG 24 hr tablet Take 50 mg by mouth daily.  03/04/19   [provider]  omeprazole (PRILOSEC) 40 MG capsule Take 1 capsule (40 mg total) by mouth daily. 04/05/19   Maczis, Elmer Sow, PA-C  ondansetron (ZOFRAN) 4 MG tablet Take 1 tablet (4 mg total) by mouth every 6 (six) hours as needed for nausea or vomiting. 04/05/19   Maczis, Elmer Sow, PA-C  oxyCODONE (OXY IR/ROXICODONE) 5 MG immediate release tablet Take 1 tablet (5 mg total) by  mouth every 4 (four) hours as needed for severe pain or breakthrough pain (Pain not relieved by Tylenol). 04/11/19   Sherrie George, PA-C  potassium chloride (KLOR-CON) 10 MEQ tablet Take 10 mEq by mouth daily. 03/26/19   [provider]  raloxifene (EVISTA) 60 MG tablet Take 60 mg by mouth daily.  04/24/18   [provider]    Allergies    Ace inhibitors  Review of Systems   Review of Systems  Constitutional:  Negative for fever.  Respiratory:  Negative for shortness of breath.   Cardiovascular:  Negative for chest pain.  Gastrointestinal:  Positive for nausea. Negative for abdominal pain.  Genitourinary:  Negative for hematuria.  Musculoskeletal:  Positive for back pain.  All other systems reviewed and are negative.  Physical Exam Updated Vital Signs BP (!) 153/78 (BP Location: Right Arm)   Pulse 85   Temp 99.1 F (37.3 C) (Oral)   Ht 1.575 m (5\' 2" )   Wt 72.5 kg   SpO2 100%   BMI 29.23 kg/m   Physical Exam Vitals and nursing note reviewed.  Constitutional:      Appearance: She is well-developed. She is not ill-appearing.  HENT:     Head: Normocephalic and atraumatic.     Mouth/Throat:     Mouth: Mucous membranes are moist.  Eyes:     Pupils: Pupils are equal, round, and reactive to light.  Cardiovascular:     Rate and Rhythm: Normal rate and regular rhythm.     Heart sounds: Normal heart sounds.  Pulmonary:     Effort: Pulmonary effort is normal. No respiratory distress.     Breath sounds: No wheezing.  Abdominal:     General: Bowel sounds are normal.     Palpations: Abdomen is soft.     Tenderness: There is no right CVA tenderness or left CVA tenderness.  Musculoskeletal:     Cervical back: Neck supple.     Right lower leg: No edema.     Left lower leg: No edema.     Comments: 5 out of 5 strength bilateral lower extremities  Skin:    General: Skin is warm and dry.  Neurological:     Mental Status: She is alert and oriented to person,  place, and time.  Psychiatric:        Mood and Affect: Mood normal.    ED Results / Procedures / Treatments   Labs (all labs ordered are listed, but only abnormal results are displayed) Labs Reviewed  CBC WITH DIFFERENTIAL/PLATELET - Abnormal; Notable for the following components:      Result Value   WBC 15.7 (*)    Neutro Abs 13.5 (*)    All  other components within normal limits  BASIC METABOLIC PANEL - Abnormal; Notable for the following components:   Sodium 132 (*)    Potassium 3.3 (*)    Chloride 94 (*)    Glucose, Bld 123 (*)    All other components within normal limits  URINALYSIS, ROUTINE W REFLEX MICROSCOPIC - Abnormal; Notable for the following components:   Ketones, ur 15 (*)    All other components within normal limits    EKG None  Radiology DG Lumbar Spine Complete  Result Date: 10/11/2020 CLINICAL DATA:  Two weeks of low back pain. EXAM: LUMBAR SPINE - COMPLETE 4+ VIEW COMPARISON:  None. FINDINGS: No fracture or traumatic malalignment. Mild multilevel degenerative disc disease with tiny anterior osteophytes. Calcified atherosclerosis in the abdominal aorta. IMPRESSION: 1. Mild multilevel degenerative disc disease with tiny anterior osteophytes. 2. Calcified atherosclerosis in the abdominal aorta. Electronically Signed   By: Gerome Sam III M.D   On: 10/11/2020 11:26   CT Renal Stone Study  Result Date: 10/12/2020 CLINICAL DATA:  Flank pain, kidney stone suspected. Right sided lower back pain. Nausea and vomiting. EXAM: CT ABDOMEN AND PELVIS WITHOUT CONTRAST TECHNIQUE: Multidetector CT imaging of the abdomen and pelvis was performed following the standard protocol without IV contrast. COMPARISON:  CT abdomen pelvis 04/05/2019. FINDINGS: Lower chest: No acute abnormality.  Tiny hiatal hernia. Hepatobiliary: No focal liver abnormality. Status post cholecystectomy. No biliary dilatation. Pancreas: No focal lesion. Normal pancreatic contour. No surrounding inflammatory  changes. No main pancreatic ductal dilatation. Spleen: Normal in size without focal abnormality. Adrenals/Urinary Tract: No adrenal nodule bilaterally. No nephrolithiasis, no hydronephrosis, and no contour-deforming renal mass. No ureterolithiasis or hydroureter. The urinary bladder is unremarkable. Stomach/Bowel: Stomach is within normal limits. No evidence of bowel wall thickening or dilatation. Scattered colonic diverticulosis. Appendix appears normal. Vascular/Lymphatic: No abdominal aorta or iliac aneurysm. Moderate to severe atherosclerotic plaque of the aorta and its branches. No abdominal, pelvic, or inguinal lymphadenopathy. Reproductive: Uterus and bilateral adnexa are unremarkable. Other: No intraperitoneal free fluid. No intraperitoneal free gas. No organized fluid collection. Musculoskeletal: No abdominal wall hernia or abnormality. No suspicious lytic or blastic osseous lesions. No acute displaced fracture. Mid to lower thoracic spine degenerative changes. Inferior endplate L4 vertebral body degenerative changes. Densely sclerotic lesion within the right femoral head likely represents a bone island. IMPRESSION: 1. No nephroureterolithiasis bilaterally. 2. Normal appendix. 3. Scattered colonic diverticulosis with no acute diverticulitis. 4. Unremarkable ovaries/adnexa. 5. Degenerative changes of the spine. 6.  Aortic Atherosclerosis (ICD10-I70.0). 7. Status post cholecystectomy. 8. Limited evaluation on this noncontrast study. Electronically Signed   By: Tish Frederickson M.D.   On: 10/12/2020 01:38    Procedures Procedures   Medications Ordered in ED Medications  ondansetron (ZOFRAN) injection 4 mg (4 mg Intravenous Given 10/12/20 0126)  sodium chloride 0.9 % bolus 500 mL (500 mLs Intravenous New Bag/Given 10/12/20 0304)    ED Course  I have reviewed the triage vital signs and the nursing notes.  Pertinent labs & imaging results that were available during my care of the patient were reviewed by  me and considered in my medical decision making (see chart for details).    MDM Rules/Calculators/A&P                           Patient presents with ongoing back pain.  Now with nausea and vomiting.  She was seen and evaluated earlier.  Pain thought to be musculoskeletal in nature.  Nausea may be related to narcotic medication locations.  However, given ongoing pain and now nausea, will proceed with further work-up.  Other considerations include kidney stone, UTI.  Patient was given fluids and nausea medication.  Labs obtained.  Labs do indicate some element of dehydration with ketones in the urine and mild hyponatremia.  CT scan does not show any evidence of kidney stone.  No other obvious source of back pain.  She does have some aortic arthrosclerosis but no dilation to suggest aneurysm.  On recheck, patient states she feels much better and is tolerating fluids.  She is hemodynamically stable and has no signs or symptoms of cauda equina.  Will discharge home with Zofran.  After history, exam, and medical workup I feel the patient has been appropriately medically screened and is safe for discharge home. Pertinent diagnoses were discussed with the patient. Patient was given return precautions.  Final Clinical Impression(s) / ED Diagnoses Final diagnoses:  Chronic right-sided low back pain, unspecified whether sciatica present  Nausea  Dehydration    Rx / DC Orders ED Discharge Orders          Ordered    ondansetron (ZOFRAN ODT) 4 MG disintegrating tablet  Every 8 hours PRN        10/12/20 0343             Shon Baton, MD 10/12/20 0345

## 2020-10-22 ENCOUNTER — Other Ambulatory Visit: Payer: Self-pay | Admitting: Internal Medicine

## 2020-10-22 DIAGNOSIS — M5416 Radiculopathy, lumbar region: Secondary | ICD-10-CM

## 2020-10-31 ENCOUNTER — Ambulatory Visit
Admission: RE | Admit: 2020-10-31 | Discharge: 2020-10-31 | Disposition: A | Discharge status: Home / Self Care | Payer: Medicare Other | Source: Ambulatory Visit | Attending: Internal Medicine | Admitting: Internal Medicine

## 2020-10-31 ENCOUNTER — Other Ambulatory Visit: Payer: Self-pay

## 2020-10-31 DIAGNOSIS — M5416 Radiculopathy, lumbar region: Secondary | ICD-10-CM

## 2021-10-15 IMAGING — CT CT RENAL STONE PROTOCOL
2 of 4 series · 15 of 46 positions shown, 17 images · non-contrast
Comparison: CT abdomen pelvis 04/05/2019.

CLINICAL DATA: Flank pain, kidney stone suspected. Right sided
lower back pain. Nausea and vomiting.

EXAM:
CT ABDOMEN AND PELVIS WITHOUT CONTRAST
TECHNIQUE: Multidetector CT imaging of the abdomen and pelvis was performed
following the standard protocol without IV contrast.

[Series 2: stone full · axial · 0.64mm/px · z∈[-1120,-705]mm · 12 of 95 slices shown, 14 images]
[im 8/95  soft-tissue]
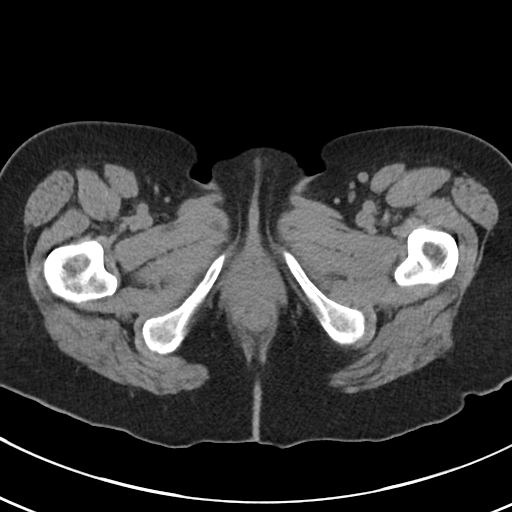
[im 8/95  bone]
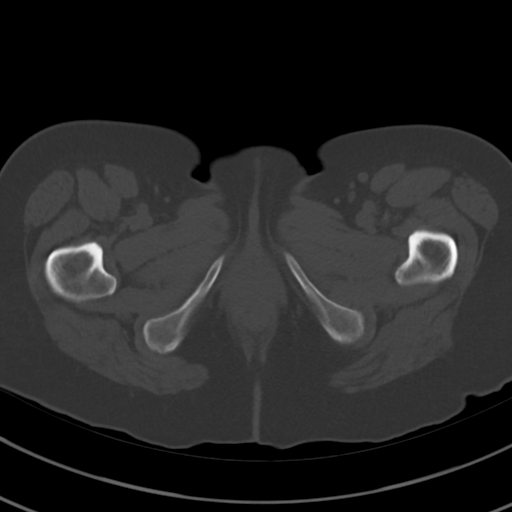
[im 16/95  soft-tissue]
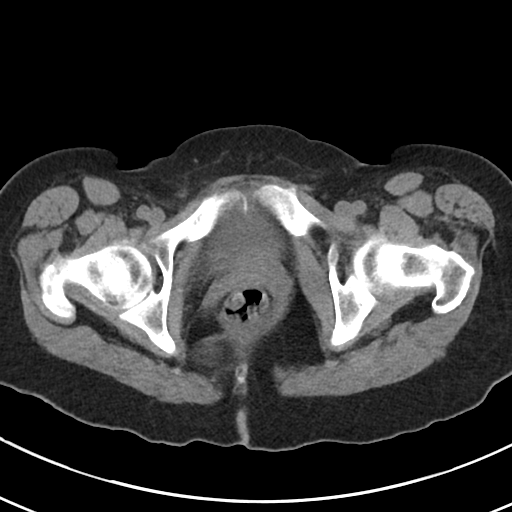
[im 23/95  soft-tissue]
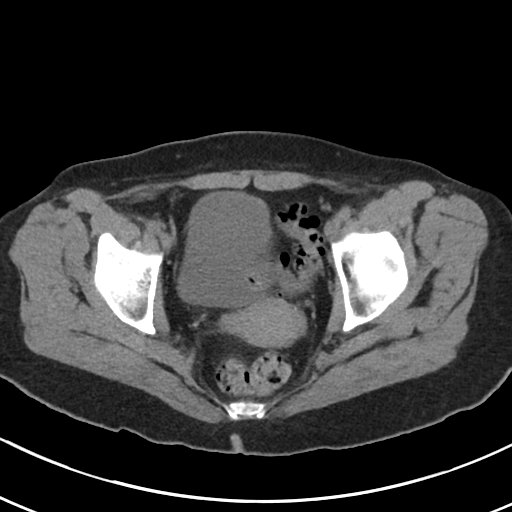
[im 31/95  soft-tissue]
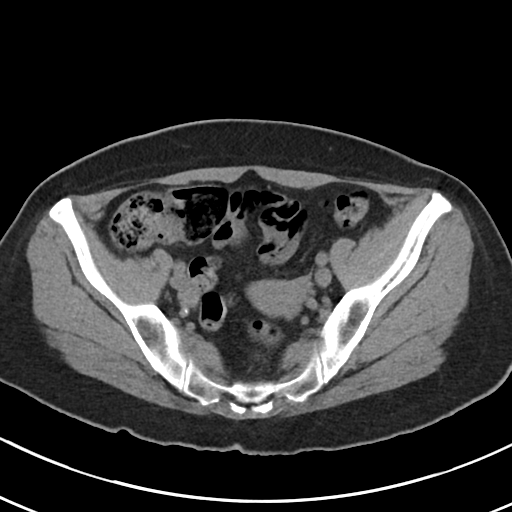
[im 38/95  soft-tissue]
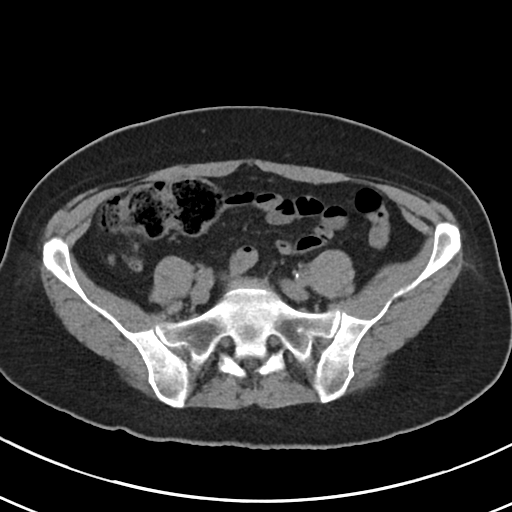
[im 46/95  soft-tissue]
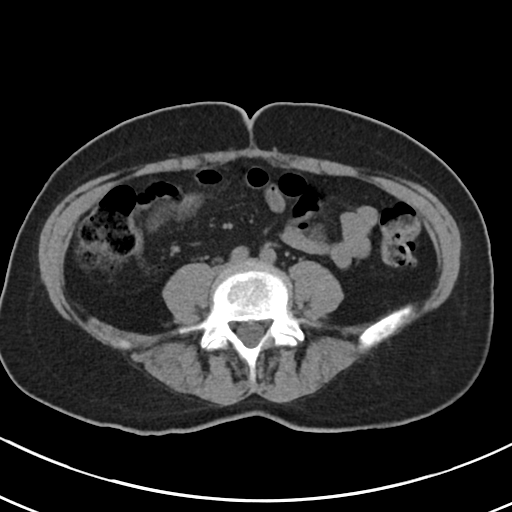
[im 53/95  soft-tissue]
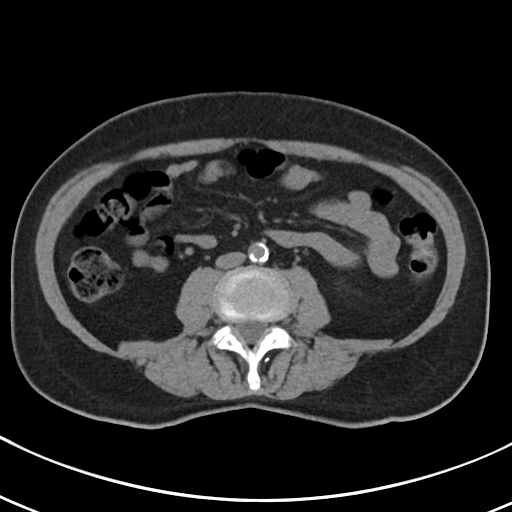
[im 61/95  soft-tissue]
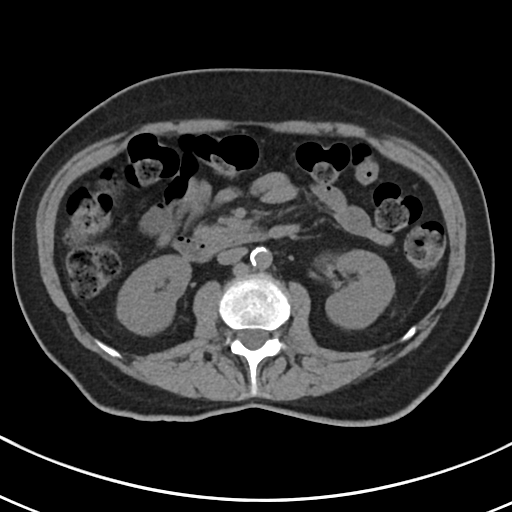
[im 68/95  soft-tissue]
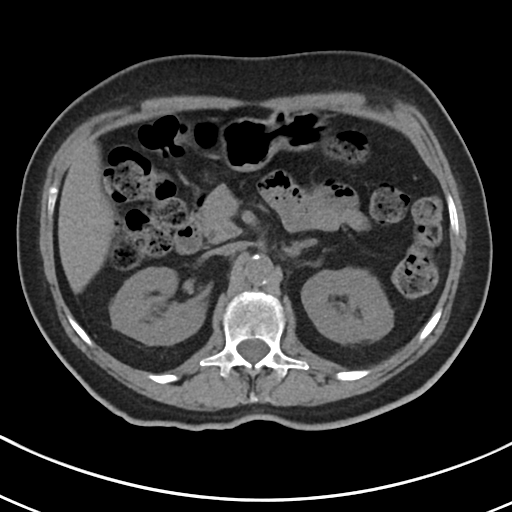
[im 68/95  bone]
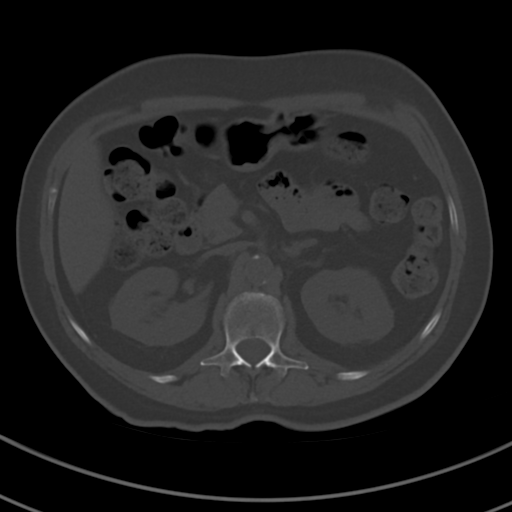
[im 76/95  soft-tissue]
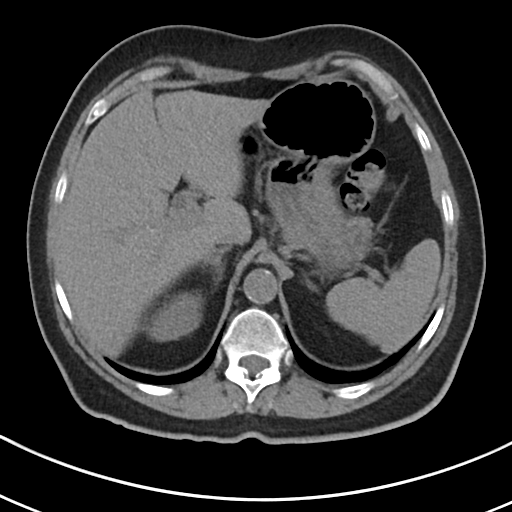
[im 83/95  soft-tissue]
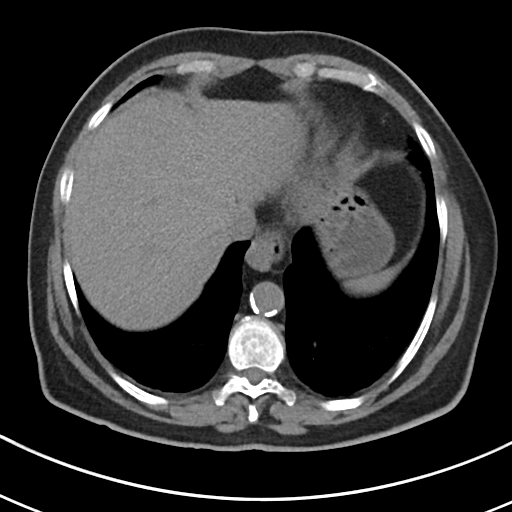
[im 91/95  soft-tissue]
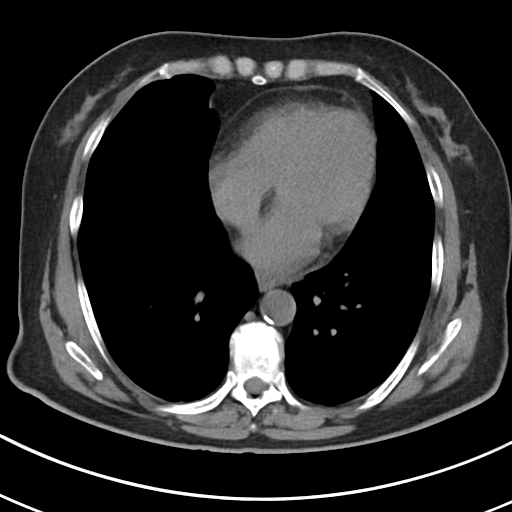

[Series 5: coronal · coronal · 0.67mm/px · 3 of 94 slices shown]
[im 32/94  soft-tissue]
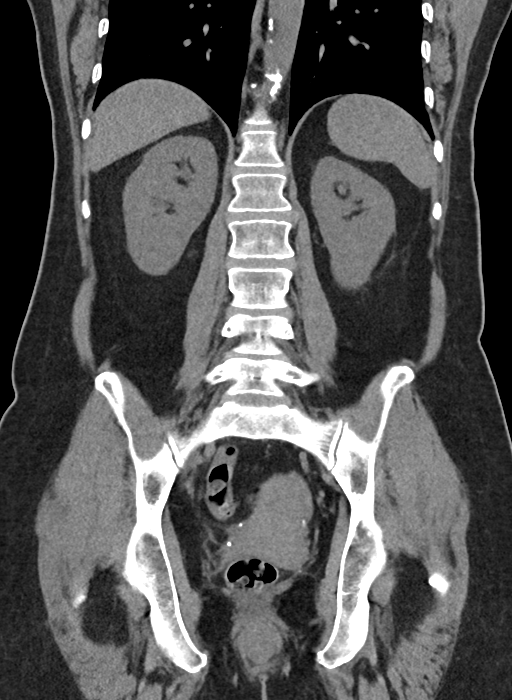
[im 42/94  soft-tissue]
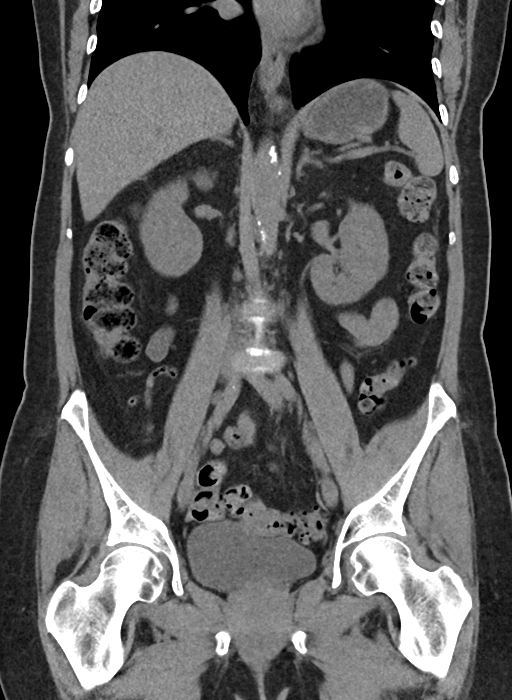
[im 52/94  soft-tissue]
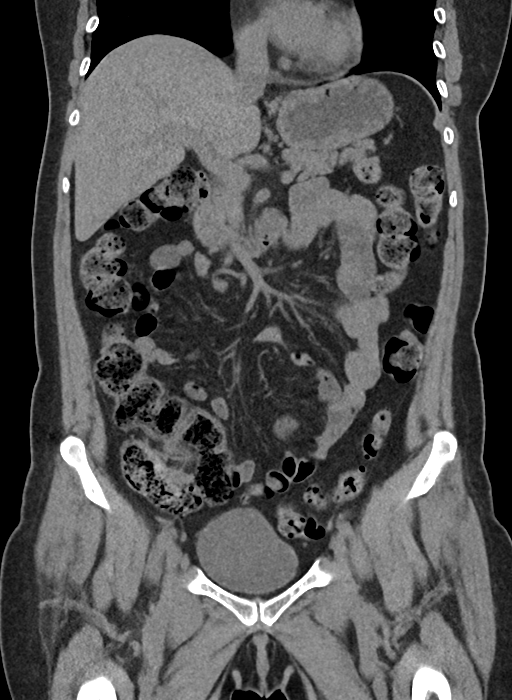

[15 of 46 positions shown; findings below may reference images not displayed]

FINDINGS: Lower chest: No acute abnormality.  Tiny hiatal hernia.

Hepatobiliary: No focal liver abnormality. Status post
cholecystectomy. No biliary dilatation.

Pancreas: No focal lesion. Normal pancreatic contour. No surrounding
inflammatory changes. No main pancreatic ductal dilatation.

Spleen: Normal in size without focal abnormality.

Adrenals/Urinary Tract:

No adrenal nodule bilaterally.

No nephrolithiasis, no hydronephrosis, and no contour-deforming
renal mass. No ureterolithiasis or hydroureter.

The urinary bladder is unremarkable.

Stomach/Bowel: Stomach is within normal limits. No evidence of bowel
wall thickening or dilatation. Scattered colonic diverticulosis.
Appendix appears normal.

Vascular/Lymphatic: No abdominal aorta or iliac aneurysm. Moderate
to severe atherosclerotic plaque of the aorta and its branches. No
abdominal, pelvic, or inguinal lymphadenopathy.

Reproductive: Uterus and bilateral adnexa are unremarkable.

Other: No intraperitoneal free fluid. No intraperitoneal free gas.
No organized fluid collection.

Musculoskeletal:

No abdominal wall hernia or abnormality.

No suspicious lytic or blastic osseous lesions. No acute displaced
fracture. Mid to lower thoracic spine degenerative changes. Inferior
endplate L4 vertebral body degenerative changes. Densely sclerotic
lesion within the right femoral head likely represents a bone
island.
IMPRESSION: 1. No nephroureterolithiasis bilaterally.
2. Normal appendix.
3. Scattered colonic diverticulosis with no acute diverticulitis.
4. Unremarkable ovaries/adnexa.
5. Degenerative changes of the spine.
6.  Aortic Atherosclerosis ([id number]).
7. Status post cholecystectomy.
8. Limited evaluation on this noncontrast study.

## 2021-11-03 IMAGING — MR MR LUMBAR SPINE W/O CM
2 series · 28 of 28 positions shown · non-contrast
Comparison: CT abdomen/pelvis 10/12/2020, lumbar spine radiograph
10/11/2020

CLINICAL DATA: Low back pain for 2 months

EXAM:
MRI LUMBAR SPINE WITHOUT CONTRAST
TECHNIQUE: Multiplanar, multisequence MR imaging of the lumbar spine was
performed. No intravenous contrast was administered.

[Series 6: T2 · sagittal · 4.0mm · 0.73mm/px · 14 of 14 slices shown]
[im 1/14]
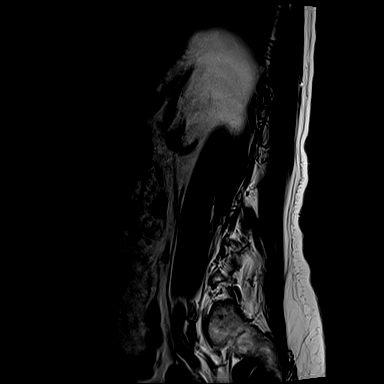
[im 2/14]
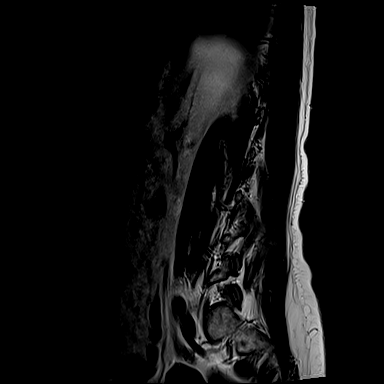
[im 3/14]
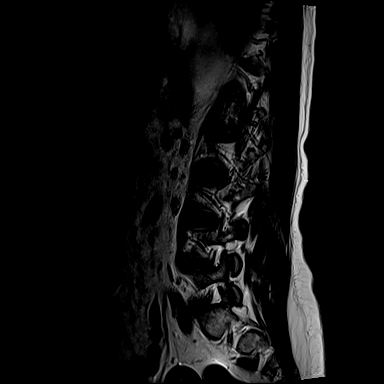
[im 4/14]
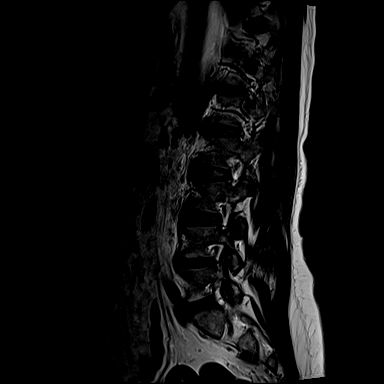
[im 5/14]
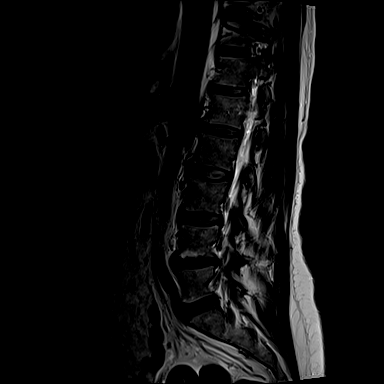
[im 6/14]
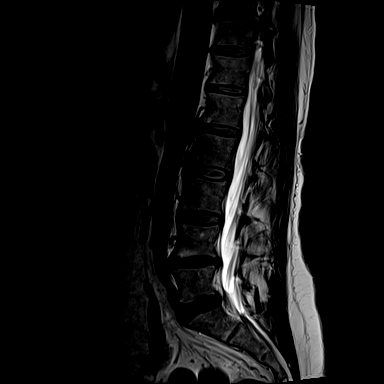
[im 7/14]
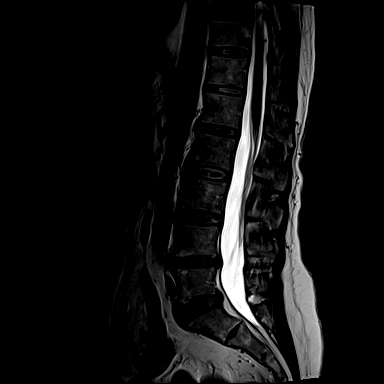
[im 8/14]
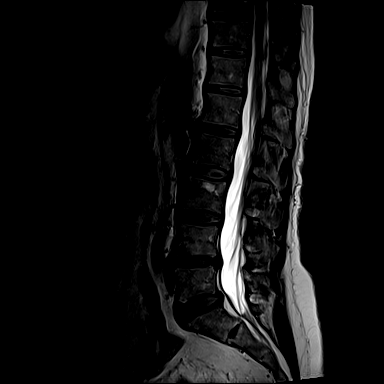
[im 9/14]
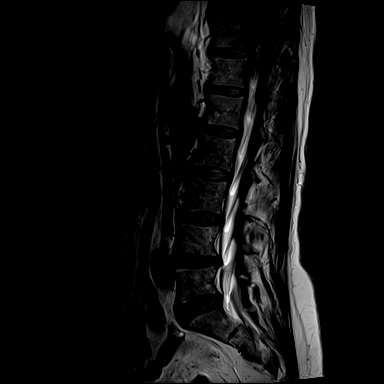
[im 10/14]
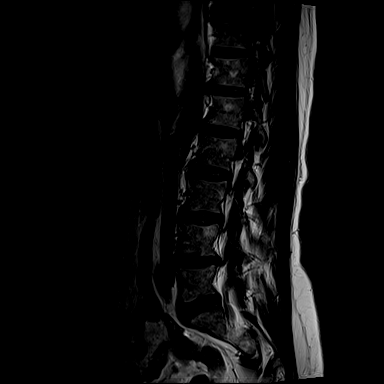
[im 11/14]
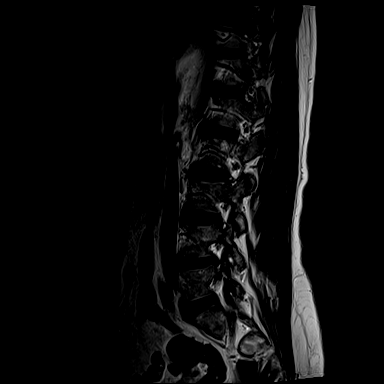
[im 12/14]
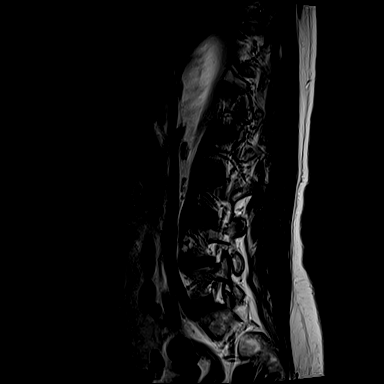
[im 13/14]
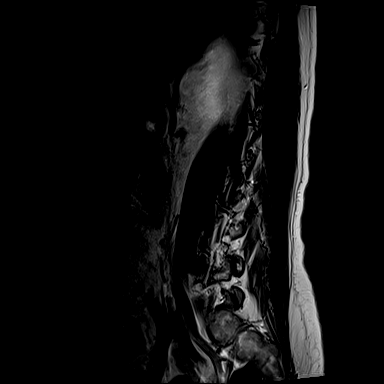
[im 14/14]
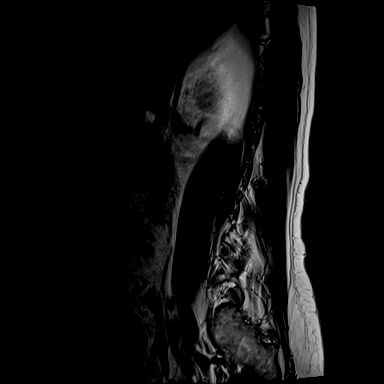

[Series 7: T1 · sagittal · 4.0mm · 0.73mm/px · 14 of 14 slices shown]
[im 1/14]
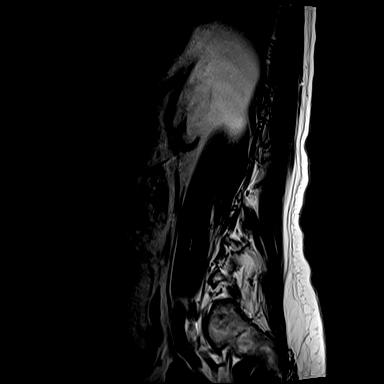
[im 2/14]
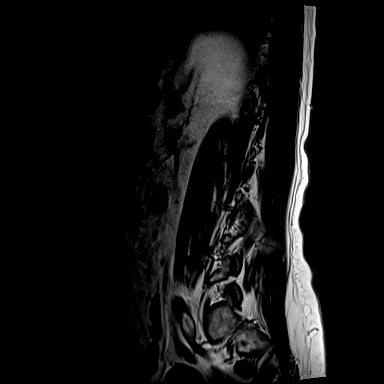
[im 3/14]
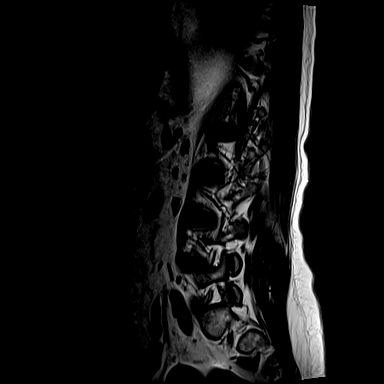
[im 4/14]
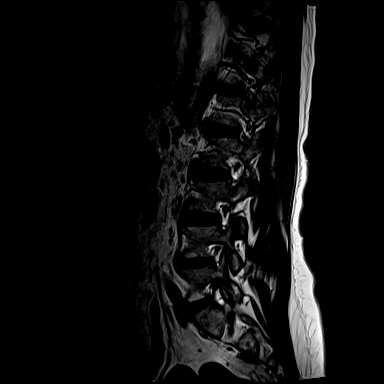
[im 5/14]
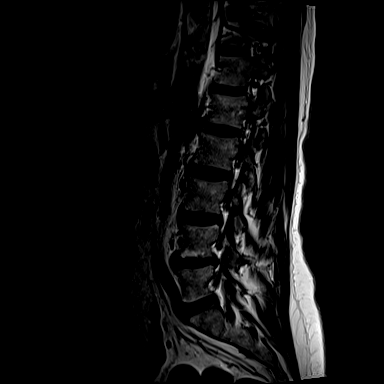
[im 6/14]
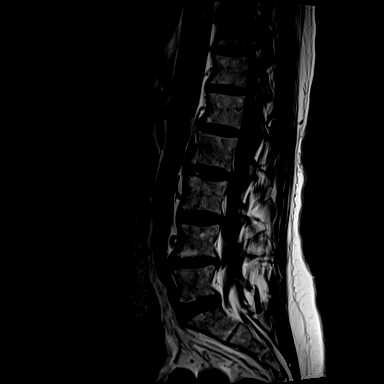
[im 7/14]
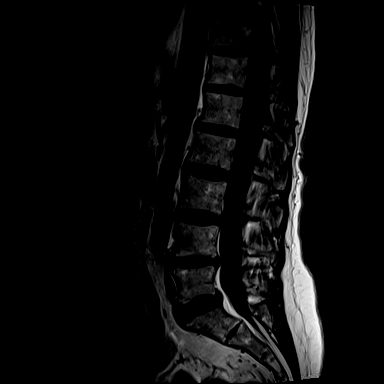
[im 8/14]
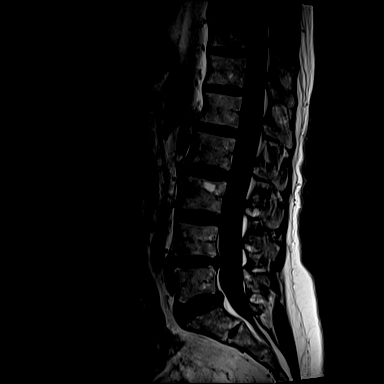
[im 9/14]
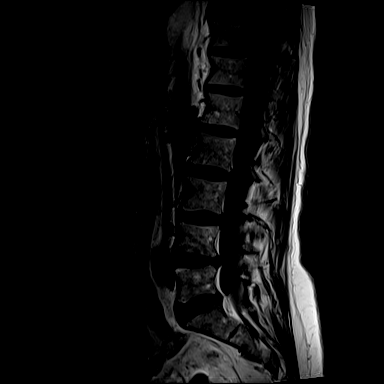
[im 10/14]
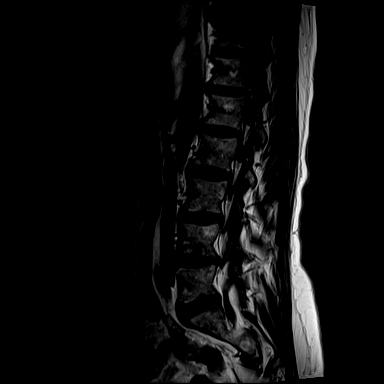
[im 11/14]
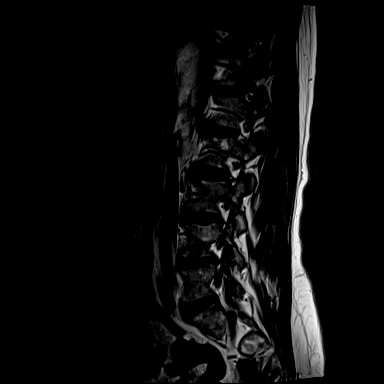
[im 12/14]
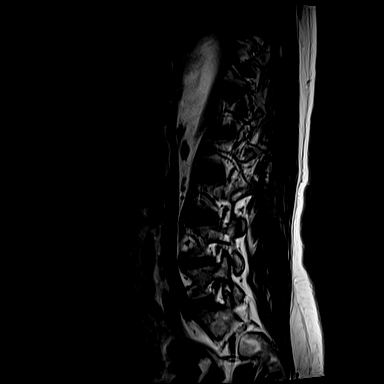
[im 13/14]
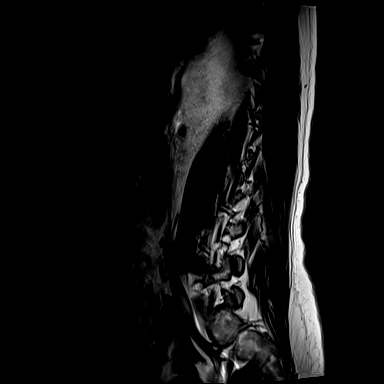
[im 14/14]
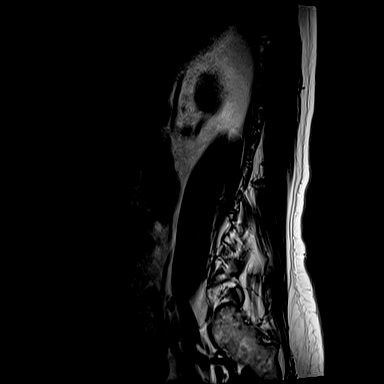

[28 of 28 positions shown; findings below may reference images not displayed]

FINDINGS: Segmentation:  Standard.

Alignment: There is straightening of the normal lumbar spine
lordosis. There is no antero or retrolisthesis.

Vertebrae: Vertebral body heights are preserved. Mild degenerative
endplate marrow signal abnormality is seen along the inferior L4
endplate. A focus of T1 and T2 hyperintensity in the L3 vertebral
body likely reflects an intraosseous hemangioma. There is no
suspicious marrow signal abnormality.

Conus medullaris and cauda equina: Conus extends to the L1-L2 level.
Conus and cauda equina appear normal.

Paraspinal and other soft tissues: Negative.

Disc levels:

There is mild disc desiccation and narrowing at L4-L5. The remaining
disc spaces are overall preserved.

There is mild facet arthropathy throughout the lumbar spine with
small effusions at L4-L5 and L5-S1, more so on the right.

T12-L1 through L3-L4: No significant spinal canal or neural
foraminal stenosis.

L4-L5: There is a mild disc bulge and bilateral facet arthropathy
without significant spinal canal or neural foraminal stenosis.

L5-S1: There is a mild disc bulge and bilateral facet arthropathy
without significant spinal canal or neural foraminal stenosis.
IMPRESSION: 1. Mild bilateral facet arthropathy at L4-L5 and L5-S1 with trace
associated effusions, more so on the right.
2. Otherwise, mild degenerative changes in the lumbar spine detailed
above without significant spinal canal or neural foraminal stenosis;
no evidence of nerve root impingement.

## 2022-11-11 ENCOUNTER — Other Ambulatory Visit (HOSPITAL_BASED_OUTPATIENT_CLINIC_OR_DEPARTMENT_OTHER): Payer: Self-pay | Admitting: Internal Medicine

## 2022-11-11 DIAGNOSIS — E782 Mixed hyperlipidemia: Secondary | ICD-10-CM

## 2022-11-21 ENCOUNTER — Ambulatory Visit (HOSPITAL_COMMUNITY)
Admission: RE | Admit: 2022-11-21 | Discharge: 2022-11-21 | Disposition: A | Discharge status: Home / Self Care | Payer: Medicare Other | Source: Ambulatory Visit | Attending: Internal Medicine | Admitting: Internal Medicine

## 2022-11-21 DIAGNOSIS — E782 Mixed hyperlipidemia: Secondary | ICD-10-CM | POA: Insufficient documentation

## 2022-12-14 ENCOUNTER — Encounter: Payer: Self-pay | Admitting: Cardiology

## 2022-12-14 ENCOUNTER — Ambulatory Visit: Payer: Medicare Other | Attending: Cardiology | Admitting: Cardiology

## 2022-12-14 VITALS — BP 124/68 | HR 83 | Resp 16 | Ht 62.0 in | Wt 149.2 lb

## 2022-12-14 DIAGNOSIS — R931 Abnormal findings on diagnostic imaging of heart and coronary circulation: Secondary | ICD-10-CM | POA: Diagnosis present

## 2022-12-14 DIAGNOSIS — I2510 Atherosclerotic heart disease of native coronary artery without angina pectoris: Secondary | ICD-10-CM | POA: Insufficient documentation

## 2022-12-14 NOTE — Patient Instructions (Signed)
Medication Instructions:   *If you need a refill on your cardiac medications before your next appointment, please call your pharmacy*   Lab Work: LIPO A today  Lipid in 6 months prior to her appt   If you have labs (blood work) drawn today and your tests are completely normal, you will receive your results only by: MyChart Message (if you have MyChart) OR A paper copy in the mail If you have any lab test that is abnormal or we need to change your treatment, we will call you to review the results.   Testing/Procedures: Your physician has requested that you have en exercise stress myoview. For further information please visit [email or web address] Please follow instruction sheet, as given.    Follow-Up: At [facility], you and your health needs are our priority.  As part of our continuing mission to provide you with exceptional heart care, we have created designated Provider Care Teams.  These Care Teams include your primary Cardiologist (physician) and Advanced Practice Providers (APPs -  Physician Assistants and Nurse Practitioners) who all work together to provide you with the care you need, when you need it.  We recommend signing up for the patient portal called "MyChart".  Sign up information is provided on this After Visit Summary.  MyChart is used to connect with patients for Virtual Visits (Telemedicine).  Patients are able to view lab/test results, encounter notes, upcoming appointments, etc.  Non-urgent messages can be sent to your provider as well.   To learn more about what you can do with MyChart, go to [email or web address].    Your next appointment:  6 months with Dr Rosemary Holms

## 2022-12-14 NOTE — Progress Notes (Addendum)
Cardiology Office Note:  .   Date:  12/14/2022  ID:  Miranda Wells, DOB Nov 18, 1955, MRN [id number] PCP: Marden Noble, MD  [facility] Providers Cardiologist:  Truett Mainland, MD PCP: Marden Noble, MD/ Hillard Danker, MD  Chief Complaint:  Chief Complaint  Patient presents with   Coronary artery calcification   New Patient (Initial Visit)           History of Present Illness: .   Miranda Wells is a 67 y.o. female with hypertension, hyperlipidemia, family h/o CAD, coronary calcium score 88th percentile  Patient is retired Psychologist, occupational, lives in [place].  She walks 30-40 minutes most times a day without any significant difficulty.  She has family history of coronary artery disease.  Recently, patient underwent coronary calcium score scan, that showed calcium score of 88th percentile, including in left main.  She has been on aspirin and Lipitor 40 mg daily with well-controlled LDL.  Vitals:   12/14/22 1025  BP: 124/68  Pulse: 83  Resp: 16  SpO2: 96%     ROS:  Review of Systems  Cardiovascular:  Negative for chest pain, dyspnea on exertion, leg swelling, palpitations and syncope.     Studies Reviewed: Marland Kitchen        EKG 10/2/224: Normal sinus rhythm Nonspecific ST abnormality When compared with ECG of 10-Apr-2019 07:12, PREVIOUS ECG IS PRESENT     CT cardiac scoring 11/21/2022: LM: 5.37 LAD: 197 Lcx: 2.03 RCA: 168   Total: 372 Percentile: 88   Pericardium: Normal.   Ascending Aorta: Normal caliber with mild calcification of the aortic root and proximal ascending aorta.   Non-cardiac: No significant findings   11/11/2022: Glucose 95, BUN/Cr 14/0.7. EGFR 94. K 3.5. Hb 12.9 Chol 130, TG 146, HDL 34, LDL 70 TSH 1.8 normal   Physical Exam:   Physical Exam Vitals and nursing note reviewed.  Constitutional:      General: She is not in acute distress. Neck:     Vascular: No JVD.  Cardiovascular:     Rate and Rhythm: Normal rate and regular rhythm.      Heart sounds: Normal heart sounds. No murmur heard. Pulmonary:     Effort: Pulmonary effort is normal.     Breath sounds: Normal breath sounds. No wheezing or rales.  Musculoskeletal:     Right lower leg: No edema.     Left lower leg: No edema.      VISIT DIAGNOSES:   ICD-10-CM   1. Coronary artery calcification  I25.10 EKG 12-Lead    2. Agatston coronary artery calcium score between 100 and 400  R93.1     3. Coronary artery disease involving native coronary artery of native heart without angina pectoris  I25.10        ASSESSMENT AND PLAN: .    Miranda Wells is a 67 y.o. female with  hypertension, hyperlipidemia, family h/o CAD, coronary calcium score 88th percentile  CAD: Based on CT cardiac scoring scan showing 88 percentile coronary calcium score. She does not have any symptoms of angina at baseline.  However, given presence of calcium and left main, I recommend an exercise nuclear stress test will unmask latent symptoms or any high risk features. Continue Aspirin 81 mg daily.. Given that her LDL is well-controlled Lipitor 40 mg daily, high calcium score is surprising.  I will check lipoprotein a. If lipoprotein a is normal, I would then increase Lipitor to 80 mg daily for goal LDL <55. Continue regular exercise.  She already follows Mediterranean style diet. Repeat lipid panel in 6 months.  If lipids controlled and no other issues at this time, I will see her as needed.  Informed Consent   Shared Decision Making/Informed Consent The risks [chest pain, shortness of breath, cardiac arrhythmias, dizziness, blood pressure fluctuations, myocardial infarction, stroke/transient ischemic attack, nausea, vomiting, allergic reaction, radiation exposure, metallic taste sensation and life-threatening complications (estimated to be 1 in 10,000)], benefits (risk stratification, diagnosing coronary artery disease, treatment guidance) and alternatives of a nuclear stress test were  discussed in detail with Ms. Muchmore and she agrees to proceed.       No orders of the defined types were placed in this encounter.    F/u in 6 months  Signed, Elder Negus, MD

## 2022-12-15 LAB — LIPOPROTEIN A (LPA): Lipoprotein (a): 46.3 nmol/L (ref <75.0)

## 2022-12-22 ENCOUNTER — Ambulatory Visit (HOSPITAL_COMMUNITY): Payer: Medicare Other

## 2023-01-03 ENCOUNTER — Telehealth (HOSPITAL_COMMUNITY): Payer: Self-pay | Admitting: *Deleted

## 2023-01-03 NOTE — Telephone Encounter (Signed)
Left detailed instructions for MPI study. 

## 2023-01-05 ENCOUNTER — Ambulatory Visit (HOSPITAL_COMMUNITY): Payer: Medicare Other | Attending: Cardiology

## 2023-01-05 DIAGNOSIS — I2510 Atherosclerotic heart disease of native coronary artery without angina pectoris: Secondary | ICD-10-CM | POA: Insufficient documentation

## 2023-01-05 DIAGNOSIS — R931 Abnormal findings on diagnostic imaging of heart and coronary circulation: Secondary | ICD-10-CM | POA: Diagnosis present

## 2023-01-05 LAB — MYOCARDIAL PERFUSION IMAGING
Angina Index: 0
Duke Treadmill Score: 5
Estimated workload: 6.5
Exercise duration (min): 4 min
Exercise duration (sec): 35 s
LV dias vol: 60 mL (ref 46–106)
LV sys vol: 16 mL
MPHR: 152 {beats}/min
Nuc Stress EF: 73 %
Peak HR: 151 {beats}/min
Percent HR: 99 %
Rest HR: 86 {beats}/min
Rest Nuclear Isotope Dose: 8.1 mCi
SDS: 3
SRS: 3
SSS: 6
ST Depression (mm): 0 mm
Stress Nuclear Isotope Dose: 27.2 mCi
TID: 0.93

## 2023-01-05 MED ORDER — TECHNETIUM TC 99M TETROFOSMIN IV KIT: 8.1000 | PACK | Freq: Once | INTRAVENOUS | Status: AC | PRN

## 2023-01-05 MED ORDER — TECHNETIUM TC 99M TETROFOSMIN IV KIT: 27.2000 | PACK | Freq: Once | INTRAVENOUS | Status: AC | PRN

## 2023-06-08 DIAGNOSIS — I10 Essential (primary) hypertension: Secondary | ICD-10-CM | POA: Insufficient documentation

## 2023-06-08 DIAGNOSIS — E782 Mixed hyperlipidemia: Secondary | ICD-10-CM | POA: Insufficient documentation

## 2023-06-08 NOTE — Progress Notes (Unsigned)
 Cardiology Office Note:  .   Date:  06/08/2023  ID:  Miranda Wells, DOB 1955-06-14, MRN [id number] PCP: Thana Ates, MD  [facility] Providers Cardiologist:  Truett Mainland, MD PCP: Thana Ates, MD/ Hillard Danker, MD  Chief Complaint:  No chief complaint on file.     History of Present Illness: .   Miranda Wells is a 68 y.o. female with hypertension, hyperlipidemia, family h/o CAD, coronary calcium score 88th percentile  *** Patient is retired Psychologist, occupational, lives in [place].  She walks 30-40 minutes most times a day without any significant difficulty.  She has family history of coronary artery disease.  Recently, patient underwent coronary calcium score scan, that showed calcium score of 88th percentile, including in left main.  She has been on aspirin and Lipitor 40 mg daily with well-controlled LDL.  There were no vitals filed for this visit.    ROS:  Review of Systems  Cardiovascular:  Negative for chest pain, dyspnea on exertion, leg swelling, palpitations and syncope.     Studies Reviewed: Marland Kitchen        EKG 10/2/224: Normal sinus rhythm Nonspecific ST abnormality When compared with ECG of 10-Apr-2019 07:12, PREVIOUS ECG IS PRESENT    Stress test 12/2022:   The study is normal. Findings are consistent with no ischemia and no infarction. The study is low risk.   No ST deviation was noted.   LV perfusion is normal. There is no evidence of ischemia. There is no evidence of infarction.   Left ventricular function is normal. Nuclear stress EF: 73%. The left ventricular ejection fraction is hyperdynamic (>65%). End diastolic cavity size is normal. End systolic cavity size is normal.   Prior study not available for comparison.   Hypertensive blood pressure response to exercise.   Exercise capacity mildly imparied.    CT cardiac scoring 11/21/2022: LM: 5.37 LAD: 197 Lcx: 2.03 RCA: 168   Total: 372 Percentile: 88   Pericardium: Normal.   Ascending Aorta:  Normal caliber with mild calcification of the aortic root and proximal ascending aorta.   Non-cardiac: No significant findings   11/11/2022: Glucose 95, BUN/Cr 14/0.7. EGFR 94. K 3.5. Hb 12.9 Chol 130, TG 146, HDL 34, LDL 70 TSH 1.8 normal   Physical Exam:   Physical Exam Vitals and nursing note reviewed.  Constitutional:      General: She is not in acute distress. Neck:     Vascular: No JVD.  Cardiovascular:     Rate and Rhythm: Normal rate and regular rhythm.     Heart sounds: Normal heart sounds. No murmur heard. Pulmonary:     Effort: Pulmonary effort is normal.     Breath sounds: Normal breath sounds. No wheezing or rales.  Musculoskeletal:     Right lower leg: No edema.     Left lower leg: No edema.      VISIT DIAGNOSES: No diagnosis found.    ASSESSMENT AND PLAN: .    Miranda Wells is a 68 y.o. female with  hypertension, hyperlipidemia, family h/o CAD, coronary calcium score 88th percentile  CAD: Based on CT cardiac scoring scan showing 88 percentile coronary calcium score. No ischemia on stress testing (12/2022). Continue Aspirin 81 mg daily, Lipitor 40 mg daily. LDL 70 (12/2022). If lipoprotein a is normal, I would then increase Lipitor to 80 mg daily for goal LDL <55. Continue regular exercise.  She already follows Mediterranean style diet. ***  No orders of the defined types  were placed in this encounter.    F/u in 6 months  Signed, Elder Negus, MD

## 2023-06-09 ENCOUNTER — Encounter: Payer: Self-pay | Admitting: Cardiology

## 2023-06-09 ENCOUNTER — Ambulatory Visit: Attending: Cardiology | Admitting: Cardiology

## 2023-06-09 VITALS — BP 150/78 | HR 75 | Resp 16 | Ht 62.0 in | Wt 151.0 lb

## 2023-06-09 DIAGNOSIS — I2510 Atherosclerotic heart disease of native coronary artery without angina pectoris: Secondary | ICD-10-CM | POA: Insufficient documentation

## 2023-06-09 DIAGNOSIS — R931 Abnormal findings on diagnostic imaging of heart and coronary circulation: Secondary | ICD-10-CM | POA: Insufficient documentation

## 2023-06-09 DIAGNOSIS — I10 Essential (primary) hypertension: Secondary | ICD-10-CM | POA: Diagnosis present

## 2023-06-09 DIAGNOSIS — E782 Mixed hyperlipidemia: Secondary | ICD-10-CM | POA: Insufficient documentation

## 2023-06-09 MED ORDER — LABETALOL HCL 100 MG PO TABS: 100.0000 mg | ORAL_TABLET | Freq: Two times a day (BID) | ORAL | 2 refills | Status: AC

## 2023-06-09 NOTE — Patient Instructions (Addendum)
 Medication Instructions:   STOP TAKING METOPROLOL SUCCINATE NOW  START TAKING LABETALOL 100 MG BY MOUTH TWICE DAILY  *If you need a refill on your cardiac medications before your next appointment, please call your pharmacy*   You have been referred to OUR BLOOD PRESSURE CLINIC TO SEE THE PHARMACIST IN 3 MONTHS   Lab Work:  TODAY DOWNSTAIRS FIRST FLOOR--LIPIDS, LIPOPROTEIN A, RENIN/ALDOSTERONE, AND TSH  If you have labs (blood work) drawn today and your tests are completely normal, you will receive your results only by: MyChart Message (if you have MyChart) OR A paper copy in the mail If you have any lab test that is abnormal or we need to change your treatment, we will call you to review the results.   Testing/Procedures:  Your physician has requested that you have a renal artery duplex. During this test, an ultrasound is used to evaluate blood flow to the kidneys. Allow one hour for this exam. Do not eat after midnight the day before and avoid carbonated beverages. Take your medications as you usually do.   Follow-Up: At [facility], you and your health needs are our priority.  As part of our continuing mission to provide you with exceptional heart care, our providers are all part of one team.  This team includes your primary Cardiologist (physician) and Advanced Practice Providers or APPs (Physician Assistants and Nurse Practitioners) who all work together to provide you with the care you need, when you need it.  Your next appointment:   6 month(s)  Provider:   DR. Rosemary Holms        1st Floor: - Lobby - Registration  - Pharmacy  - Lab - Cafe  2nd Floor: - PV Lab - Diagnostic Testing (echo, CT, nuclear med)  3rd Floor: - Vacant  4th Floor: - TCTS (cardiothoracic surgery) - AFib Clinic - Structural Heart Clinic - Vascular Surgery  - Vascular Ultrasound  5th Floor: - HeartCare Cardiology (general and EP) - Clinical Pharmacy for coumadin,  hypertension, lipid, weight-loss medications, and med management appointments    Valet parking services will be available as well.

## 2023-06-13 ENCOUNTER — Other Ambulatory Visit: Payer: Medicare Other

## 2023-06-13 NOTE — Progress Notes (Signed)
 LDL is 84.  Recommend increasing Lipitor to 80 mg daily for goal LDL <70. Rest of the labs look good. Repeat lipid panel in 3 months.  Thanks MJP

## 2023-06-14 ENCOUNTER — Other Ambulatory Visit: Payer: Self-pay

## 2023-06-14 DIAGNOSIS — E782 Mixed hyperlipidemia: Secondary | ICD-10-CM

## 2023-06-14 LAB — ALDOSTERONE + RENIN ACTIVITY W/ RATIO
Aldos/Renin Ratio: 2.1 (ref 0.0–30.0)
Aldosterone: 10 ng/dL (ref 0.0–30.0)
Renin Activity, Plasma: 4.843 ng/mL/h (ref 0.167–5.380)

## 2023-06-14 LAB — FH LIPID PROFILE WITH INTERP
Cholesterol, Total: 146 mg/dL (ref 100–199)
HDL: 38 mg/dL — ABNORMAL LOW (ref >39)
LDL Chol Calc (NIH): 84 mg/dL (ref 0–99)
Triglycerides: 134 mg/dL (ref 0–149)
VLDL Cholesterol Cal: 24 mg/dL (ref 5–40)

## 2023-06-14 LAB — LIPOPROTEIN A (LPA): Lipoprotein (a): 63.8 nmol/L (ref <75.0)

## 2023-06-14 LAB — TSH: TSH: 1.88 u[IU]/mL (ref 0.450–4.500)

## 2023-06-14 MED ORDER — ATORVASTATIN CALCIUM 80 MG PO TABS: 80.0000 mg | ORAL_TABLET | Freq: Every day | ORAL | 3 refills | Status: AC

## 2023-06-15 ENCOUNTER — Ambulatory Visit: Payer: Medicare Other | Admitting: Cardiology

## 2023-06-22 ENCOUNTER — Ambulatory Visit (HOSPITAL_COMMUNITY)
Admission: RE | Admit: 2023-06-22 | Discharge: 2023-06-22 | Disposition: A | Discharge status: Home / Self Care | Source: Ambulatory Visit | Attending: Internal Medicine | Admitting: Internal Medicine

## 2023-06-22 DIAGNOSIS — I10 Essential (primary) hypertension: Secondary | ICD-10-CM | POA: Insufficient documentation

## 2023-06-26 ENCOUNTER — Encounter: Payer: Self-pay | Admitting: Cardiology

## 2023-08-22 ENCOUNTER — Ambulatory Visit: Attending: Cardiology | Admitting: Pharmacist

## 2023-08-22 VITALS — BP 146/80 | HR 74

## 2023-08-22 DIAGNOSIS — I10 Essential (primary) hypertension: Secondary | ICD-10-CM

## 2023-08-22 NOTE — Progress Notes (Signed)
 Patient ID: JERZI TIGERT                 DOB: 1955-06-22                      MRN: [id number]      HPI: SHAHAD MAZUREK is a 68 y.o. female referred by Dr. Filiberto Hug to HTN clinic. PMH is significant for hypertension, hyperlipidemia, family h/o CAD, coronary calcium  score 372 (88th percentile).   Patient seen by Dr. Filiberto Hug 06/09/23. Metoprolol  was changed to labetalol  100mg  BID. Renal untrasound showed 1-59% stenosis of both right and left renal arteries, aldosterone/renin labs normal.   Patient presents today for follow up. She reports that her blood pressure in Dr office is almost always higher than at home. Reports average 45 day BP of 112/56. This AM at home it was 128/64. She walks 20 min per day at a brisk pace. When she tries to lift weights it bothers her back. She tries to limit salt and drinks no caffeine.   Her home cuff has not been validated. She says her husband has a cuff from [employer] and her cuff runs a little higher than his (about 10 points higher). Has been on these BP meds (minus the labetolol) for several years.  She checks her blood pressure around 9 AM which is about 1 hour after her a.m. meds and around 5 PM which is about an hour after her other medications.  She has an upper arm [employer] blood pressure cuff which has not been validated.  Of note she states that she was started on clonidine for anxiety when she was working.  Does not report any side effects from it except for an occasional weird dream.  Current HTN meds: amlodipine 10mg  daily, clonidine 0.2mg  in the evening, hydrochlorothiazide 25mg  daily, labetalol  100mg  twice a day, losartan 100mg  daily Previously tried: metoprolol  (changed to labetalol ), ACE- cough BP goal: <130/80  Family History:  Family History  Problem Relation Age of Onset   Leukemia Mother    Stroke Father 24   Heart disease Sister    Heart disease Brother    Diabetes Maternal Grandmother     Social History: no tobacco, no ETOH  Diet: no  caffeine, tries to watch salt- does eat some microwave meals  Exercise:  Walks about 20 min per day - quick pace, can't do weights too much due to back pain  Home BP readings:  Average 112/56 128/64 this AM  Wt Readings from Last 3 Encounters:  06/09/23 151 lb (68.5 kg)  12/14/22 149 lb 3.2 oz (67.7 kg)  10/12/20 159 lb 13.3 oz (72.5 kg)   BP Readings from Last 3 Encounters:  08/22/23 (!) 146/80  06/09/23 (!) 150/78  12/14/22 124/68   Pulse Readings from Last 3 Encounters:  08/22/23 74  06/09/23 75  12/14/22 83    Renal function: CrCl cannot be calculated (Patient's most recent lab result is older than the maximum 21 days allowed.).  Past Medical History:  Diagnosis Date   GERD (gastroesophageal reflux disease)    Hypercholesteremia    Hypertension     Current Outpatient Medications on File Prior to Visit  Medication Sig Dispense Refill   amLODipine (NORVASC) 10 MG tablet Take 10 mg by mouth daily.      Ascorbic Acid (VITAMIN C PO) Take 1 tablet by mouth daily.      atorvastatin  (LIPITOR) 80 MG tablet Take 1 tablet (80 mg total) by mouth  daily. 90 tablet 3   Cholecalciferol (VITAMIN D3 PO) Take 1 tablet by mouth daily.      cloNIDine (CATAPRES) 0.1 MG tablet Take 0.2 mg by mouth every evening.      hydrochlorothiazide (HYDRODIURIL) 25 MG tablet Take 25 mg by mouth daily.      labetalol  (NORMODYNE ) 100 MG tablet Take 1 tablet (100 mg total) by mouth 2 (two) times daily. 180 tablet 2   losartan (COZAAR) 100 MG tablet Take 100 mg by mouth daily.     methocarbamol  (ROBAXIN ) 750 MG tablet Take 1 tablet (750 mg total) by mouth 3 (three) times daily as needed (muscle spasm/pain). 15 tablet 0   omeprazole  (PRILOSEC) 40 MG capsule Take 1 capsule (40 mg total) by mouth daily. 30 capsule 0   potassium chloride  SA (KLOR-CON  M) 20 MEQ tablet Take 20 mEq by mouth daily.     raloxifene (EVISTA) 60 MG tablet Take 60 mg by mouth daily.      acetaminophen  (TYLENOL ) 325 MG tablet You  can take 1000 mg every 8 hours as needed.  For the next 24-48 take it on schedule.  As your pain improves you can go back to every 8 hours as needed.  You can buy this over-the-counter at any drugstore.  Do not take more than 4000 mg of Tylenol  per day it can harm your liver.     famciclovir (FAMVIR) 125 MG tablet Take 125 mg by mouth 2 (two) times daily. For 5 days for outbreak     Current Facility-Administered Medications on File Prior to Visit  Medication Dose Route Frequency Provider Last Rate Last Admin   technetium tetrofosmin  (TC-MYOVIEW ) injection 27.2 millicurie  27.2 millicurie Intravenous Once PRN Maudine Sos, MD       technetium tetrofosmin  (TC-MYOVIEW ) injection 8.1 millicurie  8.1 millicurie Intravenous Once PRN Maudine Sos, MD        Allergies  Allergen Reactions   Ace Inhibitors Cough    Blood pressure (!) 146/80, pulse 74.   Assessment/Plan: HYPERTENSION CONTROL Vitals:   08/22/23 0959 08/22/23 1003  BP: (!) 148/78 (!) 146/80    The patient's blood pressure is elevated above target today.  In order to address the patient's elevated BP: Blood pressure will be monitored at home to determine if medication changes need to be made.; The blood pressure is usually elevated in clinic.  Blood pressures monitored at home have been optimal.      1. Hypertension -  Primary hypertension Assessment: Blood pressure is elevated in clinic today Per patient she frequently has elevated blood pressures in clinic but much better blood pressures at home She reports the average blood pressure over the last 45 days 112/56 Home blood pressure cuff has not been validated She exercises on a regular basis-walks at a brisk pace for about 20 minutes daily.  No resistance training, says it hurts her back  Plan: Continue amlodipine 10 mg daily, clonidine 0.2mg  in the evening, hydrochlorothiazide 25mg  daily, labetalol  100mg  twice a day, losartan 100mg  daily She will bring in her  blood pressure cuff when she comes for lab work in 2 weeks (6/24 @ 8AM) .  If blood pressure cuff is validated as accurate then no changes are needed    Thank you  Tanayia Wahlquist D Eavan Gonterman, Pharm.Monika Annas, CPP [facility] A Division of [facility] [address] [place], [place] [address]  Phone: [phone]; Fax: [phone]

## 2023-08-22 NOTE — Assessment & Plan Note (Signed)
 Assessment: Blood pressure is elevated in clinic today Per patient she frequently has elevated blood pressures in clinic but much better blood pressures at home She reports the average blood pressure over the last 45 days 112/56 Home blood pressure cuff has not been validated She exercises on a regular basis-walks at a brisk pace for about 20 minutes daily.  No resistance training, says it hurts her back  Plan: Continue amlodipine 10 mg daily, clonidine 0.2mg  in the evening, hydrochlorothiazide 25mg  daily, labetalol  100mg  twice a day, losartan 100mg  daily She will bring in her blood pressure cuff when she comes for lab work in 2 weeks (6/24 @ 8AM) .  If blood pressure cuff is validated as accurate then no changes are needed

## 2023-08-22 NOTE — Patient Instructions (Signed)
 Your blood pressure goal is < 130/22mmHg   Please continue amlodipine 10mg  daily, clonidine 0.2mg  in the evening, hydrochlorothiazide 25mg  daily, labetalol  100mg  twice a day, losartan 100mg  daily.  Please let me know what day you would like to stop by to have your blood pressure cuff checked.   Important lifestyle changes to control high blood pressure  Intervention  Effect on the BP   Weight loss Weight loss is one of the most effective lifestyle changes for controlling blood pressure. If you're overweight or obese, losing even a small amount of weight can help reduce blood pressure.    Blood pressure can decrease by 1 millimeter of mercury (mmHg) with each kilogram (about 2.2 pounds) of weight lost.   Exercise regularly As a general goal, aim for 30 minutes of moderate physical activity every day.    Regular physical activity can lower blood pressure by 5 - 8 mmHg.   Eat a healthy diet Eat a diet rich in whole grains, fruits, vegetables, lean meat, and low-fat dairy products. Limit processed foods, saturated fat, and sweets.    A heart-healthy diet can lower high blood pressure by 10 mmHg.   Reduce salt (sodium) in your diet Aim for 000mg  of sodium each day. Avoid deli meats, canned food, and frozen microwave meals which are high in sodium.     Limiting sodium can reduce blood pressure by 5 mmHg.   Limit alcohol One drink equals 12 ounces of beer, 5 ounces of wine, or 1.5 ounces of 80-proof liquor.    Limiting alcohol to < 1 drink a day for women or < 2 drinks a day for men can help lower blood pressure by about 4 mmHg.   To check your pressure at home you will need to:   Sit up in a chair, with feet flat on the floor and back supported. Do not cross your ankles or legs. Rest your left arm so that the cuff is about heart level. If the cuff goes on your upper arm, then just relax your arm on the table, arm of the chair, or your lap. If you have a wrist cuff, hold your  wrist against your chest at heart level. Place the cuff snugly around your arm, about 1 inch above the crease of your elbow. The cords should be inside the groove of your elbow.  Sit quietly, with the cuff in place, for about 5 minutes. Then press the power button to start a reading. Do not talk or move while the reading is taking place.  Record your readings on a sheet of paper. Although most cuffs have a memory, it is often easier to see a pattern developing when the numbers are all in front of you.  You can repeat the reading after 1-3 minutes if it is recommended.   Make sure your bladder is empty and you have not had caffeine or tobacco within the last 30 minutes   Always bring your blood pressure log with you to your appointments. If you have not brought your monitor in to be double checked for accuracy, please bring it to your next appointment.   You can find a list of validated (accurate) blood pressure cuffs at: validatebp.org

## 2023-09-05 ENCOUNTER — Telehealth: Payer: Self-pay | Admitting: Pharmacist

## 2023-09-05 NOTE — Telephone Encounter (Signed)
 Patient presents today with her home BP cuff for verification Upper arm, left arm 145/73- home cuff 145/71 home cuff 142/72- clinic cuff  Patient states that since her last visit with me BP has been mostly <130/80 119/62 133/74 125/70 128/ 107  Home cuff found to be accurate. No medication changes needed.

## 2023-09-06 ENCOUNTER — Ambulatory Visit: Payer: Self-pay | Admitting: Cardiology

## 2023-09-06 DIAGNOSIS — E782 Mixed hyperlipidemia: Secondary | ICD-10-CM

## 2023-09-06 LAB — LIPID PANEL
Chol/HDL Ratio: 4.2 ratio (ref 0.0–4.4)
Cholesterol, Total: 143 mg/dL (ref 100–199)
HDL: 34 mg/dL — ABNORMAL LOW (ref >39)
LDL Chol Calc (NIH): 89 mg/dL (ref 0–99)
Triglycerides: 111 mg/dL (ref 0–149)
VLDL Cholesterol Cal: 20 mg/dL (ref 5–40)

## 2023-09-06 MED ORDER — EZETIMIBE 10 MG PO TABS: 10.0000 mg | ORAL_TABLET | Freq: Every day | ORAL | 3 refills | Status: DC

## 2023-09-06 NOTE — Progress Notes (Signed)
 LDL remains in 80s.  Please confirm if she is taking Lipitor 80 mg daily.  If she is, I would recommend adding either Zetia 10 mg daily or refer to lipid clinic for injectable agents.  If she is not taking Lipitor 80 mg daily, please encourage compliance and repeat lipid panel in 3 months  Thanks MJP

## 2023-11-25 ENCOUNTER — Other Ambulatory Visit: Payer: Self-pay | Admitting: Cardiology

## 2023-11-29 ENCOUNTER — Encounter: Payer: Self-pay | Admitting: Cardiology

## 2023-11-29 ENCOUNTER — Ambulatory Visit: Attending: Cardiology | Admitting: Cardiology

## 2023-11-29 VITALS — BP 137/74 | HR 72 | Ht 62.0 in

## 2023-11-29 DIAGNOSIS — E782 Mixed hyperlipidemia: Secondary | ICD-10-CM | POA: Diagnosis not present

## 2023-11-29 DIAGNOSIS — I10 Essential (primary) hypertension: Secondary | ICD-10-CM | POA: Insufficient documentation

## 2023-11-29 DIAGNOSIS — R931 Abnormal findings on diagnostic imaging of heart and coronary circulation: Secondary | ICD-10-CM | POA: Insufficient documentation

## 2023-11-29 DIAGNOSIS — I2510 Atherosclerotic heart disease of native coronary artery without angina pectoris: Secondary | ICD-10-CM | POA: Insufficient documentation

## 2023-11-29 NOTE — Progress Notes (Signed)
 Cardiology Office Note:  .   Date:  11/29/2023  ID:  Miranda Wells, DOB 1955/04/26, MRN [id number] PCP: Dwight Trula SQUIBB, MD  [facility] Providers Cardiologist:  Miranda Lawrence, MD PCP: Dwight Trula SQUIBB, MD/ Trula Dwight, MD  Chief Complaint:  Chief Complaint  Patient presents with   Hypertension   Hyperlipidemia      History of Present Illness: .   Miranda Wells is a 68 y.o. female with hypertension, hyperlipidemia, family h/o CAD, coronary calcium  score 88th percentile  Patient denies any chest pain or shortness of breath symptoms. Reviewed recent lipid panel results with the patient, details below.   Vitals:   11/29/23 1027  BP: 137/74  Pulse: 72  SpO2: 98%       ROS:  Review of Systems  Cardiovascular:  Negative for chest pain, dyspnea on exertion, leg swelling, palpitations and syncope.     Studies Reviewed: SABRA        EKG 11/29/2023: Normal sinus rhythm 72 bpm Nonspecific ST and T wave abnormality When compared with ECG of 14-Dec-2022 10:22, Nonspecific T wave abnormality now evident in Anterior leads  Stress test 12/2022:   The study is normal. Findings are consistent with no ischemia and no infarction. The study is low risk.   No ST deviation was noted.   LV perfusion is normal. There is no evidence of ischemia. There is no evidence of infarction.   Left ventricular function is normal. Nuclear stress EF: 73%. The left ventricular ejection fraction is hyperdynamic (>65%). End diastolic cavity size is normal. End systolic cavity size is normal.   Prior study not available for comparison.   Hypertensive blood pressure response to exercise.   Exercise capacity mildly imparied.    CT cardiac scoring 11/21/2022: LM: 5.37 LAD: 197 Lcx: 2.03 RCA: 168   Total: 372 Percentile: 88   Pericardium: Normal.   Ascending Aorta: Normal caliber with mild calcification of the aortic root and proximal ascending aorta.   Non-cardiac: No significant findings    11/2023: Chol 105, TG 97, HDL 31, LDL 55  08/2023: Chol 143, TG 111, HDL 34, LDL 89 Lp(a) 63 Normal renin/aldosterone TSH 1.8   11/11/2022: Glucose 95, BUN/Cr 14/0.7. EGFR 94. K 3.5. Hb 12.9 Chol 130, TG 146, HDL 34, LDL 70 TSH 1.8 normal   Physical Exam:   Physical Exam Vitals and nursing note reviewed.  Constitutional:      General: She is not in acute distress. Neck:     Vascular: No JVD.  Cardiovascular:     Rate and Rhythm: Normal rate and regular rhythm.     Heart sounds: Normal heart sounds. No murmur heard. Pulmonary:     Effort: Pulmonary effort is normal.     Breath sounds: Normal breath sounds. No wheezing or rales.  Musculoskeletal:     Right lower leg: No edema.     Left lower leg: No edema.      VISIT DIAGNOSES:   ICD-10-CM   1. Primary hypertension  I10 EKG 12-Lead    2. Agatston coronary artery calcium  score between 100 and 400  R93.1 EKG 12-Lead    3. Coronary artery disease involving native coronary artery of native heart without angina pectoris  I25.10 EKG 12-Lead    4. Mixed hyperlipidemia  E78.2 EKG 12-Lead         ASSESSMENT AND PLAN: .    Miranda Wells is a 68 y.o. female with  hypertension, hyperlipidemia, family h/o CAD, coronary  calcium  score 88th percentile  CAD: Based on CT cardiac scoring scan showing 88 percentile coronary calcium  score. No ischemia on stress testing (12/2022). Continue Aspirin 81 mg daily, Lipitor 80 mg daily. LDL 70 (12/2022). If lipoprotein a is normal, I would then increase Lipitor to 80 mg daily, Zetia  10 ,g daily, LDL down to 55. Continue regular exercise.  She already follows Mediterranean style diet.  Hypertension: Now better improved.     Continue f/u w/PCP F/u as needed  Signed, Miranda JINNY Lawrence, MD

## 2023-11-29 NOTE — Patient Instructions (Signed)
 Follow-Up: At [facility], you and your health needs are our priority.  As part of our continuing mission to provide you with exceptional heart care, our providers are all part of one team.  This team includes your primary Cardiologist (physician) and Advanced Practice Providers or APPs (Physician Assistants and Nurse Practitioners) who all work together to provide you with the care you need, when you need it.  Your next appointment:   AS NEEDED   Provider:   Cody Das, MD

## 2024-03-08 ENCOUNTER — Other Ambulatory Visit: Payer: Self-pay | Admitting: Cardiology
